# Patient Record
Sex: Male | Born: 1945 | Race: Black or African American | Hispanic: No | Marital: Married | Smoking: Never smoker
Health system: Southern US, Community
[De-identification: ages and names within clinical notes are randomized; demographics above are authoritative.]

## PROBLEM LIST (undated history)

## (undated) DIAGNOSIS — I739 Peripheral vascular disease, unspecified: Secondary | ICD-10-CM

## (undated) DIAGNOSIS — I10 Essential (primary) hypertension: Secondary | ICD-10-CM

## (undated) DIAGNOSIS — R279 Unspecified lack of coordination: Secondary | ICD-10-CM

## (undated) DIAGNOSIS — I6529 Occlusion and stenosis of unspecified carotid artery: Secondary | ICD-10-CM

## (undated) DIAGNOSIS — R9431 Abnormal electrocardiogram [ECG] [EKG]: Secondary | ICD-10-CM

## (undated) DIAGNOSIS — F419 Anxiety disorder, unspecified: Secondary | ICD-10-CM

## (undated) DIAGNOSIS — F32A Depression, unspecified: Secondary | ICD-10-CM

## (undated) DIAGNOSIS — F329 Major depressive disorder, single episode, unspecified: Secondary | ICD-10-CM

## (undated) DIAGNOSIS — R079 Chest pain, unspecified: Secondary | ICD-10-CM

## (undated) HISTORY — PX: CATARACT EXTRACTION: SUR2

## (undated) HISTORY — DX: Peripheral vascular disease, unspecified: I73.9

## (undated) HISTORY — DX: Unspecified lack of coordination: R27.9

## (undated) HISTORY — DX: Major depressive disorder, single episode, unspecified: F32.9

## (undated) HISTORY — DX: Anxiety disorder, unspecified: F41.9

## (undated) HISTORY — DX: Depression, unspecified: F32.A

## (undated) HISTORY — PX: KNEE SURGERY: SHX244

## (undated) HISTORY — DX: Chest pain, unspecified: R07.9

## (undated) HISTORY — DX: Abnormal electrocardiogram (ECG) (EKG): R94.31

## (undated) HISTORY — DX: Occlusion and stenosis of unspecified carotid artery: I65.29

---

## 1966-05-01 HISTORY — PX: TONSILLECTOMY: SUR1361

## 1995-05-02 HISTORY — PX: NOSE SURGERY: SHX723

## 2010-05-01 HISTORY — PX: OTHER SURGICAL HISTORY: SHX169

## 2011-05-02 HISTORY — PX: INSERTION OF AHMED VALVE: SHX6254

## 2011-10-23 ENCOUNTER — Encounter (HOSPITAL_COMMUNITY): Payer: Self-pay | Admitting: *Deleted

## 2011-10-23 ENCOUNTER — Emergency Department (HOSPITAL_COMMUNITY)
Admission: EM | Admit: 2011-10-23 | Discharge: 2011-10-23 | Disposition: A | Discharge status: Home / Self Care | Payer: Medicare HMO | Attending: Emergency Medicine | Admitting: Emergency Medicine

## 2011-10-23 DIAGNOSIS — S139XXA Sprain of joints and ligaments of unspecified parts of neck, initial encounter: Secondary | ICD-10-CM | POA: Insufficient documentation

## 2011-10-23 DIAGNOSIS — X58XXXA Exposure to other specified factors, initial encounter: Secondary | ICD-10-CM | POA: Insufficient documentation

## 2011-10-23 DIAGNOSIS — M542 Cervicalgia: Secondary | ICD-10-CM | POA: Insufficient documentation

## 2011-10-23 DIAGNOSIS — R209 Unspecified disturbances of skin sensation: Secondary | ICD-10-CM | POA: Insufficient documentation

## 2011-10-23 DIAGNOSIS — Z9889 Other specified postprocedural states: Secondary | ICD-10-CM | POA: Insufficient documentation

## 2011-10-23 DIAGNOSIS — S161XXA Strain of muscle, fascia and tendon at neck level, initial encounter: Secondary | ICD-10-CM

## 2011-10-23 NOTE — ED Provider Notes (Signed)
History     CSN: [id number]  Arrival date & time 10/23/11  0946   First MD Initiated Contact with Patient 10/23/11 1040      Chief Complaint  Patient presents with  . Neck Pain    (Consider location/radiation/quality/duration/timing/severity/associated sxs/prior treatment) HPI Comments: Patient reports that he began having left sided posterior neck pain earlier this morning shortly after he woke up.  Pain worse with lateral rotation of the neck.  Patient reports that he has never had pain like this before.  No acute injury or trauma.  He denies any numbness or tingling.  He reports that initially his arm felt heavy, but no heaviness at this time.  No chest pain or shortness of breath.  No facial asymmetry, no difficulty swallowing, no difficulty speaking.  PMH significant for left carotid stent stent placed in January 2012.  No history of HTN or DM.  No prior history of CVA or TIA.  Patient did have lens implant of both eye January 2012.  He has not noticed any recent vision changes.    The history is provided by the patient.    History reviewed. No pertinent past medical history.  Past Surgical History  Procedure Date  . Carotid stents     History reviewed. No pertinent family history.  History  Substance Use Topics  . Smoking status: Not on file  . Smokeless tobacco: Not on file  . Alcohol Use:       Review of Systems  Constitutional: Negative for fever and chills.  HENT: Positive for neck pain and neck stiffness. Negative for trouble swallowing.   Eyes: Negative for pain and visual disturbance.  Respiratory: Negative for shortness of breath.   Cardiovascular: Negative for chest pain.  Gastrointestinal: Negative for nausea and vomiting.  Musculoskeletal: Negative for back pain, joint swelling and gait problem.  Skin: Negative for color change.  Neurological: Negative for dizziness, syncope, facial asymmetry, speech difficulty, weakness, light-headedness, numbness and  headaches.  Psychiatric/Behavioral: Negative for confusion.    Allergies  Review of patient's allergies indicates no known allergies.  Home Medications   Current Outpatient Rx  Name Route Sig Dispense Refill  . KETOTIFEN FUMARATE 0.025 % OP SOLN Both Eyes Place 1 drop into both eyes daily.      BP 137/80  Pulse 61  Temp 97.8 F (36.6 C) (Oral)  Resp 11  SpO2 96%  Physical Exam  Nursing note and vitals reviewed. Constitutional: He appears well-developed and well-nourished. No distress.  HENT:  Head: Normocephalic and atraumatic.  Mouth/Throat: Oropharynx is clear and moist.  Eyes: EOM are normal.       Artificial lens of both eyes  Neck: Normal range of motion. Neck supple.       Increased pain with lateral rotation of the neck.  Cardiovascular: Normal rate, regular rhythm, normal heart sounds and intact distal pulses.   Pulmonary/Chest: Effort normal and breath sounds normal. No respiratory distress. He has no wheezes.  Abdominal: Soft. Bowel sounds are normal.  Musculoskeletal: Normal range of motion. He exhibits no edema and no tenderness.  Neurological: He is alert. He has normal strength. He displays no tremor. No cranial nerve deficit or sensory deficit. Coordination and gait normal.  Reflex Scores:      Brachioradialis reflexes are 2+ on the right side and 2+ on the left side.      No ataxia Normal finger to nose testing.  Normal rapid alternating movements.  Skin: Skin is warm and dry. He  is not diaphoretic. No erythema.  Psychiatric: He has a normal mood and affect.    ED Course  Procedures (including critical care time)  Labs Reviewed - No data to display No results found.   No diagnosis found.    MDM  Patient presenting with left sided posterior neck pain that started this morning.  He denies any numbness or tingling.  Pain worse with lateral movement of neck.  Therefore, suspect that pain is muscular.  No focal neurological deficits on exam.  No  dizziness. No ataxia.  No vision changes.  Patient also evaluated and discussed with Dr. Juleen China.        Pascal Lux [place], PA-C 10/23/11 1826

## 2011-10-23 NOTE — ED Notes (Signed)
Patient has hx of eye surgery, he was unable to fully participate in the visual exam.  He has diff tolerating eyes open with light.  Patient states he did notice that his vision was more cloudy today.  erpa is at the bedside at this time

## 2011-10-23 NOTE — Discharge Instructions (Signed)
Cervical Sprain A cervical sprain is an injury in the neck in which the ligaments are stretched or torn. The ligaments are the tissues that hold the bones of the neck (vertebrae) in place.Cervical sprains can range from very mild to very severe. Most cervical sprains get better in 1 to 3 weeks, but it depends on the cause and extent of the injury. Severe cervical sprains can cause the neck vertebrae to be unstable. This can lead to damage of the spinal cord and can result in serious nervous system problems. Your caregiver will determine whether your cervical sprain is mild or severe. CAUSES  Severe cervical sprains may be caused by:  Contact sport injuries (football, rugby, wrestling, hockey, auto racing, gymnastics, diving, martial arts, boxing).   Motor vehicle collisions.   Whiplash injuries. This means the neck is forcefully whipped backward and forward.   Falls.  Mild cervical sprains may be caused by:   Awkward positions, such as cradling a telephone between your ear and shoulder.   Sitting in a chair that does not offer proper support.   Working at a poorly Marketing executive station.   Activities that require looking up or down for long periods of time.  SYMPTOMS   Pain, soreness, stiffness, or a burning sensation in the front, back, or sides of the neck. This discomfort may develop immediately after injury or it may develop slowly and not begin for 24 hours or more after an injury.   Pain or tenderness directly in the middle of the back of the neck.   Shoulder or upper back pain.   Limited ability to move the neck.   Headache.   Dizziness.   Weakness, numbness, or tingling in the hands or arms.   Muscle spasms.   Difficulty swallowing or chewing.   Tenderness and swelling of the neck.  DIAGNOSIS  Most of the time, your caregiver can diagnose this problem by taking your history and doing a physical exam. Your caregiver will ask about any known problems, such as  arthritis in the neck or a previous neck injury. X-rays may be taken to find out if there are any other problems, such as problems with the bones of the neck. However, an X-ray often does not reveal the full extent of a cervical sprain. Other tests such as a computed tomography (CT) scan or magnetic resonance imaging (MRI) may be needed. TREATMENT  Treatment depends on the severity of the cervical sprain. Mild sprains can be treated with rest, keeping the neck in place (immobilization), and pain medicines. Severe cervical sprains need immediate immobilization and an appointment with an orthopedist or neurosurgeon. Several treatment options are available to help with pain, muscle spasms, and other symptoms. Your caregiver may prescribe:  Medicines, such as pain relievers, numbing medicines, or muscle relaxants.   Physical therapy. This can include stretching exercises, strengthening exercises, and posture training. Exercises and improved posture can help stabilize the neck, strengthen muscles, and help stop symptoms from returning.  HOME CARE INSTRUCTIONS   Put ice on the injured area.   Put ice in a plastic bag.   Place a towel between your skin and the bag.   Leave the ice on for 15 to 20 minutes, 3 to 4 times a day.   Only take over-the-counter or prescription medicines for pain, discomfort, or fever as directed by your caregiver.   Keep all follow-up appointments as directed by your caregiver.   Make any needed adjustments to your work station to promote  good posture.   Avoid positions and activities that make your symptoms worse.   Warm up and stretch before being active to help prevent problems.  SEEK MEDICAL CARE IF:   Your pain is not controlled with medicine.   You are unable to decrease your pain medicine over time as planned.   Your activity level is not improving as expected.  SEEK IMMEDIATE MEDICAL CARE IF:   You develop any bleeding, stomach upset, or signs of an  allergic reaction to your medicine.   Your symptoms get worse.   You develop new, unexplained symptoms.   You have numbness, tingling, weakness, or paralysis in any part of your body.  MAKE SURE YOU:   Understand these instructions.   Will watch your condition.   Will get help right away if you are not doing well or get worse.  Document Released: 02/12/2007 Document Revised: 04/06/2011 Document Reviewed: 01/18/2011 [facility] Patient Information 2012 [place], [place].

## 2011-10-23 NOTE — ED Notes (Signed)
To ED for eval of left neck pain suddenly this am. States he 'went to make a move and the pain shot up my head'. Hx of carotid stents 2 yrs ago. No pain now, but feels 'a tingling' in the top of his head' and left arm tingling

## 2011-10-30 NOTE — ED Provider Notes (Signed)
Medical screening examination/treatment/procedure(s) were conducted as a shared visit with non-physician practitioner(s) and myself.  I personally evaluated the patient during the encounter.  Patient with atraumatic left lateral back pain. Woke up this morning. No acute neurological complaints and has a nonfocal neurological examination. Suspect possible skeletal given reproducibility with range of motion and palpation. Consider carotid or vertebral artery dissection but doubt. No midline spinal tenderness. Plan symptomatic treatment and outpatient followup as needed. Term precautions were discussed.  Raeford Razor, MD 10/30/11 [phone]

## 2011-11-24 ENCOUNTER — Encounter: Payer: Self-pay | Admitting: Cardiology

## 2011-11-24 ENCOUNTER — Ambulatory Visit: Payer: Medicare HMO | Admitting: Cardiology

## 2011-11-24 ENCOUNTER — Ambulatory Visit (INDEPENDENT_AMBULATORY_CARE_PROVIDER_SITE_OTHER): Payer: Medicare HMO | Admitting: Cardiology

## 2011-11-24 VITALS — BP 120/80 | HR 71 | Ht 68.0 in | Wt 208.4 lb

## 2011-11-24 DIAGNOSIS — I779 Disorder of arteries and arterioles, unspecified: Secondary | ICD-10-CM

## 2011-11-24 DIAGNOSIS — I2510 Atherosclerotic heart disease of native coronary artery without angina pectoris: Secondary | ICD-10-CM

## 2011-11-24 DIAGNOSIS — E785 Hyperlipidemia, unspecified: Secondary | ICD-10-CM

## 2011-11-24 DIAGNOSIS — I10 Essential (primary) hypertension: Secondary | ICD-10-CM

## 2011-11-24 MED ORDER — ASPIRIN EC 81 MG PO TBEC: 81.0000 mg | DELAYED_RELEASE_TABLET | Freq: Every day | ORAL | Status: DC

## 2011-11-24 NOTE — Progress Notes (Signed)
HPI Alexander Oneal is a 66 year old married African American male who is referred today by Dr. Nehemiah Settle for the past medical history of carotid artery disease in the left carotid stent.  He brings out outside records which I reviewed extensively. In summary, he had a left internal carotid artery and left common carotid or stent placed i home 05/30/2010. There were no complications. 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  He was on Plavix for while but is now on no antiplatelet drugs.  He's had a previous nuclear stress study in November 2011 which was negative for coronary ischemia with an EF of 62%. His last echocardiogram was performed in November 2011 and showed an EF of 55-60% with no shunts, no evidence of LV or LA thrombus, and a mildly dilated LA, and moderate concentric left ventricular hypertrophy. 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 He's had a negative sleep study for apnea.  He's totally asymptomatic. He has chosen not to take a statin. As mentioned, is not on antiplatelet drug.  He has no angina, ischemic vascular symptoms either cerebrovascular, coronary wise or peripheral vascular. He does have hypertension and is on medication for such. He says his blood pressures usually good control.  Past Medical History  Diagnosis Date  . Carotid artery stenosis   . Lack of coordination   . Chest pain   . EKG, abnormal   . Peripheral vascular disease     Current Outpatient Prescriptions  Medication Sig Dispense Refill  . brimonidine (ALPHAGAN P) 0.1 % SOLN Place 1 drop into the right eye daily.      . Bromfenac Sodium (BROMDAY) 0.09 % SOLN Apply to eye.      . dorzolamide-timolol (COSOPT) 22.3-6.8 MG/ML ophthalmic solution Place 1 drop into the left eye 2 (two) times daily.      Marland Kitchen ketotifen (ZADITOR) 0.025 % ophthalmic solution Place 1 drop into both eyes daily.      . timolol (BETIMOL) 0.5 % ophthalmic solution 1 drop 2 (two) times daily.      Marland Kitchen aspirin  EC 81 MG tablet Take 1 tablet (81 mg total) by mouth daily.        No Known Allergies  Family History  Problem Relation Age of Onset  . Heart attack Mother   . Heart attack Father     History   Social History  . Marital Status: Married    Spouse Name: N/A    Number of Children: N/A  . Years of Education: N/A   Occupational History  . Not on file.   Social History Main Topics  . Smoking status: Never Smoker   . Smokeless tobacco: Not on file  . Alcohol Use: No  . Drug Use: No  . Sexually Active: Not on file   Other Topics Concern  . Not on file   Social History Narrative  . No narrative on file    ROS ALL NEGATIVE EXCEPT THOSE NOTED IN HPI  PE  General Appearance: well developed, well nourished in no acute distress HEENT: symmetrical face, PERRLA, good dentition  Neck: no JVD, thyromegaly, or adenopathy, trachea midline Chest: symmetric without deformity Cardiac: PMI non-displaced, RRR, normal S1, S2, no gallop or murmur Lung: clear to ausculation and percussion Vascular: all pulses full without bruits  Abdominal: nondistended, nontender, good bowel sounds, no HSM, no bruits Extremities: no cyanosis, clubbing or edema, no sign of DVT, no varicosities  Skin: normal color, no rashes Neuro: alert and  oriented x 3, non-focal Pysch: normal affect  EKG Normal sinus rhythm, nonspecific T wave changes, no change from previous EKG 01/05/2011. BMET No results found for this basename: na, k, cl, co2, glucose, bun, creatinine, calcium, gfrnonaa, gfraa    Lipid Panel  No results found for this basename: chol, trig, hdl, cholhdl, vldl, ldlcalc    CBC No results found for this basename: wbc, rbc, hgb, hct, plt, mcv, mch, mchc, rdw, neutrabs, lymphsabs, monoabs, eosabs, basosabs

## 2011-11-24 NOTE — Patient Instructions (Addendum)
Your physician has requested that you have a carotid duplex. This test is an ultrasound of the carotid arteries in your neck. It looks at blood flow through these arteries that supply the brain with blood. Allow one hour for this exam. There are no restrictions or special instructions.  Your physician has recommended you make the following change in your medication: Start taking an 81mg  Aspirin  Your physician wants you to follow-up in: 1 year with Dr. Daleen Squibb. You will receive a reminder letter in the mail two months in advance. If you don't receive a letter, please call our office to schedule the follow-up appointment.

## 2011-11-24 NOTE — Assessment & Plan Note (Signed)
Blood pressures a little bit higher today than usual. He will followup with Dr. Nehemiah Settle.

## 2011-11-24 NOTE — Assessment & Plan Note (Signed)
He wishes not to take a statin.

## 2011-11-24 NOTE — Assessment & Plan Note (Addendum)
He is doing well with previous stenting of the left common carotid left internal carotid artery. I have suggested him to start 81 mg of enteric-coated aspirin per day. He does not wish take statin. He is afraid of them. We'll have him return in one year.

## 2011-11-27 ENCOUNTER — Other Ambulatory Visit: Payer: Self-pay | Admitting: Cardiology

## 2011-11-27 DIAGNOSIS — I6529 Occlusion and stenosis of unspecified carotid artery: Secondary | ICD-10-CM

## 2011-11-28 ENCOUNTER — Encounter: Payer: Self-pay | Admitting: Cardiology

## 2011-11-29 ENCOUNTER — Encounter (INDEPENDENT_AMBULATORY_CARE_PROVIDER_SITE_OTHER): Payer: Medicare HMO

## 2011-11-29 DIAGNOSIS — I6529 Occlusion and stenosis of unspecified carotid artery: Secondary | ICD-10-CM

## 2012-07-25 ENCOUNTER — Telehealth: Payer: Self-pay | Admitting: Diagnostic Neuroimaging

## 2012-07-25 NOTE — Telephone Encounter (Signed)
Called pt to resched per reduce sched. Spse and pt said will need to leave appt as sched.

## 2012-07-26 ENCOUNTER — Encounter: Payer: Self-pay | Admitting: Diagnostic Neuroimaging

## 2012-07-26 ENCOUNTER — Ambulatory Visit (INDEPENDENT_AMBULATORY_CARE_PROVIDER_SITE_OTHER): Payer: Medicare HMO | Admitting: Diagnostic Neuroimaging

## 2012-07-26 VITALS — BP 149/85 | HR 63 | Temp 97.4°F | Ht 69.0 in | Wt 205.0 lb

## 2012-07-26 DIAGNOSIS — H348392 Tributary (branch) retinal vein occlusion, unspecified eye, stable: Secondary | ICD-10-CM

## 2012-07-26 DIAGNOSIS — H348192 Central retinal vein occlusion, unspecified eye, stable: Secondary | ICD-10-CM | POA: Insufficient documentation

## 2012-07-26 DIAGNOSIS — R51 Headache: Secondary | ICD-10-CM

## 2012-07-26 DIAGNOSIS — H543 Unqualified visual loss, both eyes: Secondary | ICD-10-CM | POA: Insufficient documentation

## 2012-07-26 DIAGNOSIS — H348312 Tributary (branch) retinal vein occlusion, right eye, stable: Secondary | ICD-10-CM

## 2012-07-26 DIAGNOSIS — H34812 Central retinal vein occlusion, left eye: Secondary | ICD-10-CM

## 2012-07-26 DIAGNOSIS — R519 Headache, unspecified: Secondary | ICD-10-CM | POA: Insufficient documentation

## 2012-07-26 MED ORDER — ASPIRIN 81 MG PO TABS: 81.0000 mg | ORAL_TABLET | Freq: Every day | ORAL | Status: AC

## 2012-07-26 NOTE — Progress Notes (Signed)
[facility]  PATIENT: Alexander Oneal DOB: 1945/08/14  REFERRING CLINICIAN: [facility] / [place] HISTORY FROM: patient and wife REASON FOR VISIT: vision changes, headache   HISTORICAL  CHIEF COMPLAINT:  Chief Complaint  Patient presents with  . Visual Field Change    blurry vision    HISTORY OF PRESENT ILLNESS:   67 year old right-handed male with history of right branch retinal vein occlusion (ischemic), left central retinal vein occlusion (ischemic), glaucoma, cataracts, status post bilateral eye surgeries, here for evaluation of six-month history of increasing pain and hypersensitivity in the right side of his head and face, blurred vision, deteriorating vision.  Patient denies any right temporal throbbing pain, jaw claudication, severe headache. He has had increasing sensitivity in his scalp and face. This affects him when he combs his hair or brushes his face with his hand.  Patient also reports evaluation in [place], when he had onset of these visual difficulties, and was found to have left carotid stenosis, treated by a cardiologist with a carotid stent procedure. Patient was on aspirin and Plavix for a few months and then stopped.   Interestingly, patient does not have any significant vascular risk factors such as hypertension, diabetes, hypercholesteremia, smoking. Today blood pressure is 149/85. Patient's wife tells me that his blood pressure actually runs low normally, less than 120/80.  REVIEW OF SYSTEMS: Full 14 system review of systems performed and notable only for hearing loss, ringing in the ears, itching, blurred vision, loss of vision, cough, constipation, urination problems, rectal dysfunction, feeling cold, allergies, memory loss, confusion, insomnia, numbness, depression, anxiety, not in a sleep, decreased energy, change in appetite, disinterest in activities, suicidal thoughts, racing thoughts.  ALLERGIES: No Known Allergies  HOME  MEDICATIONS: Outpatient Prescriptions Prior to Visit  Medication Sig Dispense Refill  . timolol (BETIMOL) 0.5 % ophthalmic solution 1 drop 2 (two) times daily.      . brimonidine (ALPHAGAN P) 0.1 % SOLN Place 1 drop into the right eye daily.      . Bromfenac Sodium (BROMDAY) 0.09 % SOLN Apply to eye.      . dorzolamide-timolol (COSOPT) 22.3-6.8 MG/ML ophthalmic solution Place 1 drop into the left eye 2 (two) times daily.      Marland Kitchen ketotifen (ZADITOR) 0.025 % ophthalmic solution Place 1 drop into both eyes daily.      Marland Kitchen aspirin EC 81 MG tablet Take 1 tablet (81 mg total) by mouth daily.       No facility-administered medications prior to visit.    PAST MEDICAL HISTORY: Past Medical History  Diagnosis Date  . Carotid artery stenosis   . Lack of coordination   . Chest pain   . EKG, abnormal   . Peripheral vascular disease   . Depression   . Anxiety     PAST SURGICAL HISTORY: Past Surgical History  Procedure Laterality Date  . Carotid stents  2012  . Knee surgery      Right  . Cataract extraction      Both eyes  . Tonsillectomy  1968  . Nose surgery  1997  . Insertion of ahmed valve  2013    x2    FAMILY HISTORY: Family History  Problem Relation Age of Onset  . Heart attack Mother   . Heart attack Father     SOCIAL HISTORY:  History   Social History  . Marital Status: Married    Spouse Name: N/A    Number of Children: N/A  . Years of Education: N/A  Occupational History  . Not on file.   Social History Main Topics  . Smoking status: Never Smoker   . Smokeless tobacco: Not on file  . Alcohol Use: No  . Drug Use: No  . Sexually Active: Not on file   Other Topics Concern  . Not on file   Social History Narrative   Pt lives at home with his spouse. He is retired, has four children, and has some college education level. He does not consume caffeine.      PHYSICAL EXAM  Filed Vitals:   07/26/12 0901  BP: 149/85  Pulse: 63  Temp: 97.4 F (36.3 C)    TempSrc: Oral  Height: 5\' 9"  (1.753 m)  Weight: 205 lb (92.987 kg)   Body mass index is 30.26 kg/(m^2).  GENERAL EXAM: Patient is in no distress  CARDIOVASCULAR: Regular rate and rhythm, no murmurs, no carotid bruits  NEUROLOGIC: MENTAL STATUS: awake, alert, language fluent, comprehension intact, naming intact CRANIAL NERVE: no papilledema on fundoscopic exam, RIGHT PUPIL REACTIVE, LEFT PUPIL POST-SURGICAL NO RXN, visual fields full to confrontation IN RIGHT EYE; LEFT LIGHT PERCEPTION AND ABLE TO COUNT FINGER AND DETECT MOTION, extraocular muscles intact, no nystagmus, facial sensation and strength symmetric, uvula midline, shoulder shrug symmetric, tongue midline. MOTOR: normal bulk and tone, full strength in the BUE, BLE SENSORY: normal and symmetric to light touch, pinprick, temperature, vibration COORDINATION: finger-nose-finger, fine finger movements normal REFLEXES: deep tendon reflexes present and symmetric GAIT/STATION: narrow based gait; romberg is negative   DIAGNOSTIC DATA (LABS, IMAGING, TESTING) - I reviewed patient records, labs, notes, testing and imaging myself where available.  No results found for this basename: WBC, HGB, HCT, MCV, PLT   No results found for this basename: na, k, cl, co2, glucose, bun, creatinine, calcium, prot, albumin, ast, alt, alkphos, bilitot, gfrnonaa, gfraa   No results found for this basename: CHOL, HDL, LDLCALC, LDLDIRECT, TRIG, CHOLHDL   No results found for this basename: HGBA1C   No results found for this basename: VITAMINB12   No results found for this basename: TSH     ASSESSMENT AND PLAN  67 y.o. year old male  has a past medical history of Carotid artery stenosis; Lack of coordination; Chest pain; EKG, abnormal; Peripheral vascular disease; Depression; and Anxiety. here with progressive visual deterioration, multifactorial from central and branch retinal vein occlusions, glaucoma and cataracts, that is being managed by  ophthalmology [facility]). Patient has a very complex ophthalmologic history and has been recently seen by Dr. Olean Ree in [place], [place] on 07/10/12. I reviewed his clinic notes which mention above ophthalmologic diagnoses. There is no mention of concern of giant cell arteritis in these notes.  On the referring notes from patient's PCP Dr. Nehemiah Settle, there is mention of referral to see me to evaluate for giant cell arteritis at the request of the ophthalmologist. Due to this discrepancy, I called to the patient's ophthalmologist, and spoke with his colleague Dr. Duke Salvia (covering for Dr. Olean Ree) by phone, who concurred that at least by review of the notes, there is no clinical suspicion for giant cell arteritis at this time.  His more recent symptoms of scalp and head sensitivity, raise possibility of CNS involvement. I will check ESR, CRP, MRI of the brain, MRA of the head/neck for further evaluation. I have asked patient to start taking aspirin 81 mg per day as well.  I will request PCP to follow up on BP (149/85), and screen for diabetes and hyperlipidemia.  Orders Placed This Encounter  Procedures  . MR Brain Wo Contrast  . MR MRA HEAD WO CONTRAST  . MR Angiogram Neck W Wo Contrast  . Sedimentation Rate  . C-reactive Protein   I reviewed notes, called other provider and discussed by phone, reviewed labs and chart, requiring complex medical decision making. Additional testing has been ordered.  Suanne Marker, MD 07/26/2012, 10:21 AM Certified in Neurology, Neurophysiology and Neuroimaging  [facility] [address] [place], [place] [address] [phone]

## 2012-07-26 NOTE — Patient Instructions (Addendum)
Take aspirin 81mg  per day. Follow up with PCP re: high blood pressure reading today.

## 2012-07-27 LAB — C-REACTIVE PROTEIN: CRP: 0.1 mg/L (ref 0.0–4.9)

## 2012-07-30 ENCOUNTER — Telehealth: Payer: Self-pay | Admitting: Diagnostic Neuroimaging

## 2012-07-30 NOTE — Telephone Encounter (Signed)
Called hm# lvm labs normal. Tried Cynthia#, no answer, no vmail left.

## 2012-07-30 NOTE — Telephone Encounter (Signed)
Message copied by Ellin Goodie on Tue Jul 30, 2012 10:56 AM ------      Message from: Joycelyn Schmid      Created: Mon Jul 29, 2012  1:40 PM       Normal labs. Please call patient. -VRP ------

## 2012-08-05 ENCOUNTER — Ambulatory Visit
Admission: RE | Admit: 2012-08-05 | Discharge: 2012-08-05 | Disposition: A | Discharge status: Home / Self Care | Payer: Medicare HMO | Source: Ambulatory Visit | Attending: Diagnostic Neuroimaging | Admitting: Diagnostic Neuroimaging

## 2012-08-05 DIAGNOSIS — H543 Unqualified visual loss, both eyes: Secondary | ICD-10-CM

## 2012-08-05 DIAGNOSIS — R51 Headache: Secondary | ICD-10-CM

## 2012-08-05 MED ORDER — GADOBENATE DIMEGLUMINE 529 MG/ML IV SOLN
19.0000 mL | Freq: Once | INTRAVENOUS | Status: AC | PRN
  Administered 2012-08-05: 19 mL via INTRAVENOUS

## 2012-08-16 ENCOUNTER — Telehealth: Payer: Self-pay

## 2012-08-16 NOTE — Telephone Encounter (Signed)
Called to give MRI results, no answer. No vmail left. Will try later

## 2012-09-06 ENCOUNTER — Telehealth: Payer: Self-pay

## 2012-09-06 NOTE — Telephone Encounter (Signed)
Spoke to pt,  gave results. Request last OV note and MRI results sent to ophthalmologist. Faxed notes.

## 2012-09-06 NOTE — Telephone Encounter (Signed)
Message copied by Doree Barthel on Fri Sep 06, 2012  4:02 PM ------      Message from: Joycelyn Schmid      Created: Fri Aug 30, 2012  5:24 PM       Mild chronic small vessel ischemic disease. Continue current plan. Pls call pt with results.             -VRP             ----- Message -----         From: Ashley Akin         Sent: 08/13/2012  10:06 AM           To: Suanne Marker, MD                   ------

## 2012-09-11 ENCOUNTER — Telehealth: Payer: Self-pay

## 2012-09-11 NOTE — Telephone Encounter (Signed)
Scheduled 2 month f/u appt.

## 2012-09-27 ENCOUNTER — Encounter: Payer: Self-pay | Admitting: Diagnostic Neuroimaging

## 2012-09-27 ENCOUNTER — Ambulatory Visit (INDEPENDENT_AMBULATORY_CARE_PROVIDER_SITE_OTHER): Payer: Medicare HMO | Admitting: Diagnostic Neuroimaging

## 2012-09-27 VITALS — BP 120/82 | HR 72 | Temp 97.4°F | Ht 68.0 in | Wt 199.5 lb

## 2012-09-27 DIAGNOSIS — H348192 Central retinal vein occlusion, unspecified eye, stable: Secondary | ICD-10-CM

## 2012-09-27 DIAGNOSIS — H34812 Central retinal vein occlusion, left eye: Secondary | ICD-10-CM

## 2012-09-27 DIAGNOSIS — H348312 Tributary (branch) retinal vein occlusion, right eye, stable: Secondary | ICD-10-CM

## 2012-09-27 DIAGNOSIS — R51 Headache: Secondary | ICD-10-CM

## 2012-09-27 DIAGNOSIS — H543 Unqualified visual loss, both eyes: Secondary | ICD-10-CM

## 2012-09-27 DIAGNOSIS — H348392 Tributary (branch) retinal vein occlusion, unspecified eye, stable: Secondary | ICD-10-CM

## 2012-09-27 NOTE — Patient Instructions (Signed)
Follow up prn as needed.

## 2012-09-27 NOTE — Progress Notes (Signed)
[facility]  PATIENT: Alexander Oneal DOB: 1945/09/22  HISTORY FROM: patient REASON FOR VISIT: follow up   HISTORICAL  CHIEF COMPLAINT:  Chief Complaint  Patient presents with  . Follow-up    HISTORY OF PRESENT ILLNESS:   UPDATE 09/27/12:  Patient comes in for follow up today for discussion of test results. He is still experiencing blurred vision and loss of vision, which is progressively worse, with shooting pains in the right side of his head that has not changed, scalp tenderness which has not changed.  PRIOR HPI (07/26/12): 67 year old right-handed male with history of right branch retinal vein occlusion (ischemic), left central retinal vein occlusion (ischemic), glaucoma, cataracts, status post bilateral eye surgeries, here for evaluation of six-month history of increasing pain and hypersensitivity in the right side of his head and face, blurred vision, deteriorating vision.   Patient denies any right temporal throbbing pain, jaw claudication, severe headache. He has had increasing sensitivity in his scalp and face. This affects him when he combs his hair or brushes his face with his hand.  Patient also reports evaluation in [place], when he had onset of these visual difficulties, and was found to have left carotid stenosis, treated by a cardiologist with a carotid stent procedure. Patient was on aspirin and Plavix for a few months and then stopped.   Interestingly, patient does not have any significant vascular risk factors such as hypertension, diabetes, hypercholesteremia, smoking. Today blood pressure is 149/85. Patient's wife tells me that his blood pressure actually runs low normally, less than 120/80.    REVIEW OF SYSTEMS: Full 14 system review of systems performed and notable only for blurred vision, loss of vision, feeling cold, memory loss, sleepiness, depression, anxiety, suicidal thoughts, racing thoughts.  ALLERGIES: No Known Allergies  HOME  MEDICATIONS: Outpatient Prescriptions Prior to Visit  Medication Sig Dispense Refill  . aspirin 81 MG tablet Take 1 tablet (81 mg total) by mouth daily.  30 tablet    . brimonidine (ALPHAGAN P) 0.1 % SOLN Place 1 drop into the right eye daily.      . Bromfenac Sodium (BROMDAY) 0.09 % SOLN Apply to eye.      . dorzolamide-timolol (COSOPT) 22.3-6.8 MG/ML ophthalmic solution Place 1 drop into the left eye 2 (two) times daily.      Marland Kitchen ketotifen (ZADITOR) 0.025 % ophthalmic solution Place 1 drop into both eyes daily.      Marland Kitchen LECITHIN PO Take by mouth.      . magnesium 30 MG tablet Take 30 mg by mouth 2 (two) times daily.      . Multiple Vitamin (MULTIVITAMIN) tablet Take 1 tablet by mouth daily.      . timolol (BETIMOL) 0.5 % ophthalmic solution 1 drop 2 (two) times daily.       No facility-administered medications prior to visit.    PAST MEDICAL HISTORY: Past Medical History  Diagnosis Date  . Carotid artery stenosis   . Lack of coordination   . Chest pain   . EKG, abnormal   . Peripheral vascular disease   . Depression   . Anxiety     PAST SURGICAL HISTORY: Past Surgical History  Procedure Laterality Date  . Carotid stents  2012  . Knee surgery      Right  . Cataract extraction      Both eyes  . Tonsillectomy  1968  . Nose surgery  1997  . Insertion of ahmed valve  2013    x2  FAMILY HISTORY: Family History  Problem Relation Age of Onset  . Heart attack Mother   . Heart attack Father     SOCIAL HISTORY:  History   Social History  . Marital Status: Married    Spouse Name: Aram Beecham    Number of Children: 4  . Years of Education: College   Occupational History  . Not on file.   Social History Main Topics  . Smoking status: Never Smoker   . Smokeless tobacco: Never Used  . Alcohol Use: No  . Drug Use: No  . Sexually Active: Not on file   Other Topics Concern  . Not on file   Social History Narrative   Pt lives at home with his spouse. He is retired,  has four children, and has some college education level. He does not consume caffeine.      PHYSICAL EXAM  Filed Vitals:   09/27/12 1131  BP: 120/82  Pulse: 72  Temp: 97.4 F (36.3 C)  TempSrc: Oral  Height: 5\' 8"  (1.727 m)  Weight: 199 lb 8 oz (90.493 kg)    Not recorded    Body mass index is 30.34 kg/(m^2).  GENERAL EXAM: Patient is in no distress   CARDIOVASCULAR:  Regular rate and rhythm, no murmurs, no carotid bruits   NEUROLOGIC:  MENTAL STATUS: awake, alert, language fluent, comprehension intact, naming intact  CRANIAL NERVE: no papilledema on fundoscopic exam, RIGHT PUPIL REACTIVE, LEFT PUPIL POST-SURGICAL NO RXN, visual fields full to confrontation IN RIGHT EYE; LEFT LIGHT PERCEPTION AND ABLE TO COUNT FINGER AND DETECT MOTION, extraocular muscles intact, no nystagmus, facial sensation and strength symmetric, uvula midline, shoulder shrug symmetric, tongue midline.  MOTOR: normal bulk and tone, full strength in the BUE, BLE  SENSORY: normal and symmetric to light touch, pinprick, temperature, vibration  COORDINATION: finger-nose-finger, fine finger movements normal  REFLEXES: deep tendon reflexes present and symmetric  GAIT/STATION: narrow based gait; romberg is negative    DIAGNOSTIC DATA (LABS, IMAGING, TESTING) - I reviewed patient records, labs, notes, testing and imaging myself where available.  MR ANGIOGRAM NECK W WO CONTRAST 08/05/12:  1. Left common carotid artery stent, with associated signal void, and normal appearing flow signal proximal and distal to the stent. 2. 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  MRA HEAD WO CONTRAST 08/05/12: Abnormal MRA head (without) demonstrating: 1. The right middle cerebral artery branches (M2, M3) have decreased signal, may reflect atherosclerosis.  2. 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 3. Remaining medium and large sized vessels are unremarkable.  MRI BRAIN WO 08/05/12: Abnormal MRI  brain (without) demonstrating:  1. Mild chronic small vessel ischemic disease.  2. No acute findings.  ESR 4  CRP 0.1  ASSESSMENT AND PLAN   67 y.o. year old male here with progressive visual deterioration, multifactorial from central and branch retinal vein occlusions, glaucoma and cataracts, that is being managed by ophthalmology [facility] and [facility]).  No evidence of temporal arteritis at this time (by ophthalmology exam/notes and normal ESR, CRP).  His more recent symptoms of scalp and head sensitivity, coul;d be migraine variant or occipital neuralgia or paroxysmal hemicrania. Symptoms are mild and patient does not want any meds for pain/symptom control.   PLAN: 1. Request PCP to follow up on BP (149/85), and screen for diabetes and hyperlipidemia. 2. Continue aspirin 81mg  daily 3. Follow up with ophthalmology re: eyes/vision    Suanne Marker, MD 09/27/2012, 11:59 AM Certified in Neurology, Neurophysiology and Neuroimaging  [facility] [address], [address] [place], [place] [address] [phone] 

## 2012-10-01 ENCOUNTER — Encounter: Payer: Self-pay | Admitting: Cardiology

## 2012-10-01 ENCOUNTER — Ambulatory Visit (INDEPENDENT_AMBULATORY_CARE_PROVIDER_SITE_OTHER): Payer: Medicare HMO | Admitting: Cardiology

## 2012-10-01 VITALS — BP 124/82 | HR 69 | Ht 68.0 in | Wt 200.0 lb

## 2012-10-01 DIAGNOSIS — I779 Disorder of arteries and arterioles, unspecified: Secondary | ICD-10-CM

## 2012-10-01 DIAGNOSIS — I10 Essential (primary) hypertension: Secondary | ICD-10-CM

## 2012-10-01 DIAGNOSIS — E785 Hyperlipidemia, unspecified: Secondary | ICD-10-CM

## 2012-10-01 NOTE — Assessment & Plan Note (Signed)
Good control

## 2012-10-01 NOTE — Assessment & Plan Note (Signed)
Refuses to take a statin.

## 2012-10-01 NOTE — Patient Instructions (Addendum)
Your physician wants you to follow-up in: 1 YEAR with Dr Branch.  You will receive a reminder letter in the mail two months in advance. If you don't receive a letter, please call our office to schedule the follow-up appointment.  Your physician recommends that you continue on your current medications as directed. Please refer to the Current Medication list given to you today.  

## 2012-10-01 NOTE — Assessment & Plan Note (Signed)
Stable. Continue aspirin 81 mg a day. Followup in one year.

## 2012-10-01 NOTE — Progress Notes (Signed)
HPI [name] returns today for her history of carotid artery intervention, specifically stenting of the left common carotid artery and left internal carotid artery in the past. At that time I saw him last year, he did not wish take aspirin or statin.  He is a complex ophthalmology history and was recently told that he had occluded retinal veins in both eyes. He was worked up by [facility] neurology with MRI and MRA of the brain and neck. I reviewed these which did not show any problems with his carotids. He does have small vessel disease. He has one hypoplastic vertebral artery. He was advised to take aspirin which he is now doing. He still refuses to take a statin. He was not advised to take Coumadin. He also a normal CRP and sed rate.  Echocardiogram 2 years ago showed some mild left atrial dilatation. He denies any symptoms consistent with A. fib. This has never been documented. Past Medical History  Diagnosis Date  . Carotid artery stenosis   . Lack of coordination   . Chest pain   . EKG, abnormal   . Peripheral vascular disease   . Depression   . Anxiety     Current Outpatient Prescriptions  Medication Sig Dispense Refill  . aspirin 81 MG tablet Take 1 tablet (81 mg total) by mouth daily.  30 tablet    . ketotifen (ZADITOR) 0.025 % ophthalmic solution Place 1 drop into both eyes daily.      Marland Kitchen LECITHIN PO Take by mouth.      . magnesium 30 MG tablet Take 30 mg by mouth 2 (two) times daily.      . Multiple Vitamin (MULTIVITAMIN) tablet Take 1 tablet by mouth daily.      . timolol (BETIMOL) 0.5 % ophthalmic solution 1 drop 2 (two) times daily.       No current facility-administered medications for this visit.    No Known Allergies  Family History  Problem Relation Age of Onset  . Heart attack Mother   . Heart attack Father     History   Social History  . Marital Status: Married    Spouse Name: Aram Beecham    Number of Children: 4  . Years of Education: College   Occupational  History  . Not on file.   Social History Main Topics  . Smoking status: Never Smoker   . Smokeless tobacco: Never Used  . Alcohol Use: No  . Drug Use: No  . Sexually Active: Not on file   Other Topics Concern  . Not on file   Social History Narrative   Pt lives at home with his spouse. He is retired, has four children, and has some college education level. He does not consume caffeine.     ROS ALL NEGATIVE EXCEPT THOSE NOTED IN HPI  PE  General Appearance: well developed, well nourished in no acute distress, wearing sunglasses because of visual issues. HEENT: symmetrical face, PERRLA, good dentition  Neck: no JVD, thyromegaly, or adenopathy, trachea midline Chest: symmetric without deformity Cardiac: PMI non-displaced, RRR, normal S1, S2, no gallop or murmur Lung: clear to ausculation and percussion Vascular: all pulses full without bruits  Abdominal: nondistended, nontender, good bowel sounds, no HSM, no bruits Extremities: no cyanosis, clubbing or edema, no sign of DVT, no varicosities  Skin: normal color, no rashes Neuro: alert and oriented x 3, non-focal Pysch: normal affect  EKG Normal sinus rhythm, nonspecific T wave changes. BMET No results found for this basename:  na, k, cl, co2, glucose, bun, creatinine, calcium, gfrnonaa, gfraa    Lipid Panel  No results found for this basename: chol, trig, hdl, cholhdl, vldl, ldlcalc    CBC No results found for this basename: wbc, rbc, hgb, hct, plt, mcv, mch, mchc, rdw, neutrabs, lymphsabs, monoabs, eosabs, basosabs

## 2013-01-03 ENCOUNTER — Ambulatory Visit: Payer: Medicare HMO | Attending: Optometry | Admitting: Occupational Therapy

## 2013-01-03 DIAGNOSIS — IMO0001 Reserved for inherently not codable concepts without codable children: Secondary | ICD-10-CM | POA: Insufficient documentation

## 2013-01-03 DIAGNOSIS — H543 Unqualified visual loss, both eyes: Secondary | ICD-10-CM | POA: Insufficient documentation

## 2013-01-03 DIAGNOSIS — H409 Unspecified glaucoma: Secondary | ICD-10-CM | POA: Insufficient documentation

## 2013-03-07 ENCOUNTER — Encounter (INDEPENDENT_AMBULATORY_CARE_PROVIDER_SITE_OTHER): Payer: Self-pay

## 2013-03-07 ENCOUNTER — Ambulatory Visit (HOSPITAL_COMMUNITY): Payer: Medicare HMO | Attending: Cardiology

## 2013-03-07 DIAGNOSIS — I10 Essential (primary) hypertension: Secondary | ICD-10-CM | POA: Insufficient documentation

## 2013-03-07 DIAGNOSIS — E785 Hyperlipidemia, unspecified: Secondary | ICD-10-CM | POA: Insufficient documentation

## 2013-03-07 DIAGNOSIS — I658 Occlusion and stenosis of other precerebral arteries: Secondary | ICD-10-CM | POA: Insufficient documentation

## 2013-03-07 DIAGNOSIS — I6529 Occlusion and stenosis of unspecified carotid artery: Secondary | ICD-10-CM | POA: Insufficient documentation

## 2013-03-13 ENCOUNTER — Encounter (HOSPITAL_COMMUNITY): Payer: Self-pay

## 2014-01-15 ENCOUNTER — Ambulatory Visit (INDEPENDENT_AMBULATORY_CARE_PROVIDER_SITE_OTHER): Payer: Medicare HMO | Admitting: Physician Assistant

## 2014-01-15 ENCOUNTER — Encounter: Payer: Self-pay | Admitting: Physician Assistant

## 2014-01-15 VITALS — BP 92/68 | HR 64 | Ht 68.0 in | Wt 170.8 lb

## 2014-01-15 DIAGNOSIS — E785 Hyperlipidemia, unspecified: Secondary | ICD-10-CM

## 2014-01-15 DIAGNOSIS — I6529 Occlusion and stenosis of unspecified carotid artery: Secondary | ICD-10-CM

## 2014-01-15 DIAGNOSIS — I6523 Occlusion and stenosis of bilateral carotid arteries: Secondary | ICD-10-CM

## 2014-01-15 DIAGNOSIS — I658 Occlusion and stenosis of other precerebral arteries: Secondary | ICD-10-CM

## 2014-01-15 DIAGNOSIS — M542 Cervicalgia: Secondary | ICD-10-CM

## 2014-01-15 NOTE — Patient Instructions (Signed)
Your physician has requested that you have a carotid duplex YOUR APPT IS 03/18/14 @ 9 AM. This test is an ultrasound of the carotid arteries in your neck. It looks at blood flow through these arteries that supply the brain with blood. Allow one hour for this exam. There are no restrictions or special instructions.   Your physician recommends that you continue on your current medications as directed. Please refer to the Current Medication list given to you today.   Your physician wants you to follow-up in: 1 YEAR WITH DR. Johnell Comings will receive a reminder letter in the mail two months in advance. If you don't receive a letter, please call our office to schedule the follow-up appointment.

## 2014-01-15 NOTE — Progress Notes (Signed)
Cardiology Office Note    Date:  01/15/2014   ID:  Alexander Oneal, DOB 1945-11-13, MRN [id number]  PCP:  Katy Apo, MD  Cardiologist:  Dr. Valera Castle >> Dr. Tobias Alexander     History of Present Illness: Alexander Oneal is a 68 y.o. male with a hx of carotid stenosis s/p L carotid stent at [facility] in [place], [place] in 05/2010.  He was previously followed by Dr. Daleen Squibb.  He returns for evaluation of neck pain.  He recently noted L sided neck pain brought on and made worse with certain head positions.  Rubbing it makes it better.  He denies any symptoms consistent with Amaurosis Fugax or TIA.  He denies chest pain, dyspnea, orthopnea, PND, edema, syncope.     Studies:  - Echo (12/12):  EF 60-65%, borderline LVH, mild LAE, mild to mod RAE, Trace MR, mild PI, mild TR, RVSP 30-40 mmHg  - Carotid US (11/14):  Patent L CCA/ICA stent; bilateral ICA 1-39% >>FU 1 year   Recent Labs/Images: No results found for requested labs within last 365 days.     Wt Readings from Last 3 Encounters:  01/15/14 170 lb 12.8 oz (77.474 kg)  10/01/12 200 lb (90.719 kg)  09/27/12 199 lb 8 oz (90.493 kg)     Past Medical History  Diagnosis Date  . Carotid artery stenosis   . Lack of coordination   . Chest pain   . EKG, abnormal   . Peripheral vascular disease   . Depression   . Anxiety     Current Outpatient Prescriptions  Medication Sig Dispense Refill  . aspirin 81 MG tablet Take 1 tablet (81 mg total) by mouth daily.  30 tablet    . ketotifen (ZADITOR) 0.025 % ophthalmic solution Place 1 drop into both eyes daily.      Marland Kitchen LECITHIN PO Take by mouth.      . magnesium 30 MG tablet Take 30 mg by mouth 2 (two) times daily.      . Multiple Vitamin (MULTIVITAMIN) tablet Take 1 tablet by mouth daily.      . timolol (BETIMOL) 0.5 % ophthalmic solution 1 drop 2 (two) times daily.       No current facility-administered medications for this visit.     Allergies:   Acetazolamide; Brimonidine;  Dexamethasone; Ketotifen fumarate; Prednisolone; and Timolol maleate   Social History:  The patient  reports that he has never smoked. He has never used smokeless tobacco. He reports that he does not drink alcohol or use illicit drugs.   Family History:  The patient's family history includes Heart attack in his father and mother.   ROS:  Please see the history of present illness.   He has occasional indigestion.   All other systems reviewed and negative.   PHYSICAL EXAM: VS:  BP 92/68  Pulse 64  Ht  (1.727 m)  Wt 170 lb 12.8 oz (77.474 kg)  BMI 25.98 kg/m2 Well nourished, well developed, in no acute distress HEENT: normal Neck: no JVD Cardiac:  normal S1, S2; RRR; no murmur Lungs:  clear to auscultation bilaterally, no wheezing, rhonchi or rales Abd: soft, nontender, no hepatomegaly Ext: no edema Skin: warm and dry Neuro:  CNs 2-12 intact, no focal abnormalities noted  EKG:  NSR, HR 64, normal axis, no ST changes      ASSESSMENT AND PLAN:  1. Carotid stenosis, bilateral:  Continue ASA.  He refuses statin Rx.  Arrange FU Carotid  US in 03/2014.   2. Hyperlipidemia LDL goal <70:  He refuses statin Rx.  Managed by PCP. 3. Neck pain:  This is clearly MSK.  Reassurance. Conservative measures discussed.    Disposition:  He lives in [place] and would rather FU her.  FU with Dr. Tobias Alexander in 1 year.     Signed, Brynda Rim, MHS 01/15/2014 1:28 PM    [name] [address] [place], [place], [place]  [address] Phone: [phone]; Fax: [phone]

## 2014-03-18 ENCOUNTER — Ambulatory Visit (HOSPITAL_COMMUNITY): Payer: Medicare HMO | Attending: Cardiovascular Disease | Admitting: Cardiology

## 2014-03-18 DIAGNOSIS — I6523 Occlusion and stenosis of bilateral carotid arteries: Secondary | ICD-10-CM

## 2014-03-18 DIAGNOSIS — I10 Essential (primary) hypertension: Secondary | ICD-10-CM | POA: Insufficient documentation

## 2014-03-18 DIAGNOSIS — R42 Dizziness and giddiness: Secondary | ICD-10-CM | POA: Insufficient documentation

## 2014-03-18 DIAGNOSIS — E785 Hyperlipidemia, unspecified: Secondary | ICD-10-CM | POA: Diagnosis not present

## 2014-03-18 NOTE — Progress Notes (Signed)
Carotid duplex performed 

## 2014-03-20 ENCOUNTER — Telehealth: Payer: Self-pay | Admitting: *Deleted

## 2014-03-20 ENCOUNTER — Encounter: Payer: Self-pay | Admitting: Physician Assistant

## 2014-03-20 NOTE — Telephone Encounter (Signed)
s/w pt today about carotid results with verbal understanding to results given today.

## 2015-05-28 ENCOUNTER — Inpatient Hospital Stay (HOSPITAL_COMMUNITY)
Admission: EM | Admit: 2015-05-28 | Discharge: 2015-05-30 | DRG: 195 | Disposition: A | Discharge status: Home / Self Care | Payer: Medicare HMO | Attending: Internal Medicine | Admitting: Internal Medicine

## 2015-05-28 ENCOUNTER — Encounter (HOSPITAL_COMMUNITY): Payer: Self-pay | Admitting: Emergency Medicine

## 2015-05-28 ENCOUNTER — Emergency Department (HOSPITAL_COMMUNITY): Payer: Medicare HMO

## 2015-05-28 DIAGNOSIS — J189 Pneumonia, unspecified organism: Secondary | ICD-10-CM | POA: Diagnosis not present

## 2015-05-28 DIAGNOSIS — I10 Essential (primary) hypertension: Secondary | ICD-10-CM | POA: Diagnosis present

## 2015-05-28 DIAGNOSIS — I739 Peripheral vascular disease, unspecified: Secondary | ICD-10-CM | POA: Diagnosis present

## 2015-05-28 DIAGNOSIS — I6523 Occlusion and stenosis of bilateral carotid arteries: Secondary | ICD-10-CM | POA: Diagnosis present

## 2015-05-28 DIAGNOSIS — Z888 Allergy status to other drugs, medicaments and biological substances status: Secondary | ICD-10-CM

## 2015-05-28 DIAGNOSIS — J181 Lobar pneumonia, unspecified organism: Secondary | ICD-10-CM | POA: Diagnosis not present

## 2015-05-28 DIAGNOSIS — Z7982 Long term (current) use of aspirin: Secondary | ICD-10-CM

## 2015-05-28 DIAGNOSIS — D696 Thrombocytopenia, unspecified: Secondary | ICD-10-CM | POA: Diagnosis present

## 2015-05-28 HISTORY — DX: Essential (primary) hypertension: I10

## 2015-05-28 LAB — BASIC METABOLIC PANEL
Anion gap: 11 (ref 5–15)
BUN: 12 mg/dL (ref 6–20)
CHLORIDE: 104 mmol/L (ref 101–111)
CO2: 28 mmol/L (ref 22–32)
CREATININE: 1.01 mg/dL (ref 0.61–1.24)
Calcium: 9.4 mg/dL (ref 8.9–10.3)
Glucose, Bld: 130 mg/dL — ABNORMAL HIGH (ref 65–99)
Potassium: 4.1 mmol/L (ref 3.5–5.1)
SODIUM: 143 mmol/L (ref 135–145)

## 2015-05-28 LAB — CBC
HCT: 42.2 % (ref 39.0–52.0)
HEMOGLOBIN: 13.6 g/dL (ref 13.0–17.0)
MCH: 27.7 pg (ref 26.0–34.0)
MCHC: 32.2 g/dL (ref 30.0–36.0)
MCV: 85.9 fL (ref 78.0–100.0)
PLATELETS: 147 10*3/uL — AB (ref 150–400)
RBC: 4.91 MIL/uL (ref 4.22–5.81)
RDW: 12.6 % (ref 11.5–15.5)
WBC: 12 10*3/uL — ABNORMAL HIGH (ref 4.0–10.5)

## 2015-05-28 LAB — I-STAT TROPONIN, ED: TROPONIN I, POC: 0 ng/mL (ref 0.00–0.08)

## 2015-05-28 NOTE — ED Notes (Signed)
EDP at bedside  

## 2015-05-28 NOTE — ED Provider Notes (Signed)
CSN: [id number]     Arrival date & time 05/28/15  2136 History  By signing my name below, I, Alexander Oneal, attest that this documentation has been prepared under the direction and in the presence of Alexander Crease, MD . Electronically Signed: Linna Oneal, Scribe. 05/28/2015. 11:21 PM.    Chief Complaint  Patient presents with  . Tachycardia  . Back Pain      The history is provided by the patient. No language interpreter was used.     HPI Comments: Alexander Oneal is a 70 y.o. male who presents to the Emergency Department complaining of gradual onset, worsening, left-sided, lower back pain beginning 1-2 weeks ago  He reports that pain is worse with deep palpation to the area without any alleviating factors. He reports that he has been in town for a funeral, and his symptoms could be a product of the related stress. Pt reports seeing a doctor at [facility] today and was referred to the ED for associated dizziness, weakness, chills, and diaphoresis. Pt endorses mild SOB as well as a burning sensation in his left thigh. He denies chest pain, numbness, or any other associated symptoms at this time.     Past Medical History  Diagnosis Date  . Carotid artery stenosis     Carotid US (11/15):  Patent L CCA/ICA stent, bilateral ICA 1-39% >> FU 2 years  . Lack of coordination   . Chest pain   . EKG, abnormal   . Peripheral vascular disease (HCC)   . Depression   . Anxiety   . Hypertension    Past Surgical History  Procedure Laterality Date  . Carotid stents  2012  . Knee surgery      Right  . Cataract extraction      Both eyes  . Tonsillectomy  1968  . Nose surgery  1997  . Insertion of ahmed valve  2013    x2   Family History  Problem Relation Age of Onset  . Heart attack Mother   . Heart attack Father    Social History  Substance Use Topics  . Smoking status: Never Smoker   . Smokeless tobacco: Never Used  . Alcohol Use: No    Review of Systems   Constitutional: Positive for chills and diaphoresis.  Respiratory: Positive for shortness of breath (mild).   Cardiovascular: Negative for chest pain.  Neurological: Positive for dizziness and weakness. Negative for numbness.  All other systems reviewed and are negative.     Allergies  Acetazolamide; Brimonidine; Dexamethasone; Ketotifen fumarate; Prednisolone; and Timolol maleate  Home Medications   Prior to Admission medications   Medication Sig Start Date End Date Taking? Authorizing Provider  dorzolamide-timolol (COSOPT) 22.3-6.8 MG/ML ophthalmic solution Place 1 drop into both eyes 2 (two) times daily.   Yes Historical Provider, MD  aspirin 81 MG tablet Take 1 tablet (81 mg total) by mouth daily. Patient not taking: Reported on 05/28/2015 07/26/12   Suanne Marker, MD   BP 124/76 mmHg  Pulse 108  Temp(Src) 99.4 F (37.4 C) (Oral)  Resp 19  SpO2 99% Physical Exam  Constitutional: He is oriented to person, place, and time. He appears well-developed and well-nourished. No distress.  HENT:  Head: Normocephalic and atraumatic.  Right Ear: Hearing normal.  Left Ear: Hearing normal.  Nose: Nose normal.  Mouth/Throat: Oropharynx is clear and moist and mucous membranes are normal.  Eyes: Conjunctivae and EOM are normal. Pupils are equal, round, and reactive  to light.  Neck: Normal range of motion. Neck supple.  Cardiovascular: Regular rhythm, S1 normal and S2 normal.  Exam reveals no gallop and no friction rub.   No murmur heard. Pulmonary/Chest: Effort normal and breath sounds normal. No respiratory distress. He exhibits no tenderness.  Left lower chest to wall tenderness Left infrascapular tenderness  Abdominal: Soft. Normal appearance and bowel sounds are normal. There is no hepatosplenomegaly. There is no tenderness. There is no rebound, no guarding, no tenderness at McBurney's point and negative Murphy's sign. No hernia.  Musculoskeletal: Normal range of motion.   Neurological: He is alert and oriented to person, place, and time. He has normal strength. No cranial nerve deficit or sensory deficit. Coordination normal. GCS eye subscore is 4. GCS verbal subscore is 5. GCS motor subscore is 6.  Skin: Skin is warm, dry and intact. No cyanosis (fingers are dusky, but not clearly cyanotic).  Psychiatric: He has a normal mood and affect. His speech is normal and behavior is normal. Thought content normal.  Nursing note and vitals reviewed.   ED Course  Procedures (including critical care time)  DIAGNOSTIC STUDIES: Oxygen Saturation is 100% on RA, normal by my interpretation.    COORDINATION OF CARE:  11:22 PM Will order CXR and CT Chest. Will administer 80 mL injection Omnipaque. Discussed treatment plan with pt at bedside and pt agreed to plan.   Results for orders placed or performed during the hospital encounter of 05/28/15  Basic metabolic panel  Result Value Ref Range   Sodium 143 135 - 145 mmol/L   Potassium 4.1 3.5 - 5.1 mmol/L   Chloride 104 101 - 111 mmol/L   CO2 28 22 - 32 mmol/L   Glucose, Bld 130 (H) 65 - 99 mg/dL   BUN 12 6 - 20 mg/dL   Creatinine, Ser 1.61 0.61 - 1.24 mg/dL   Calcium 9.4 8.9 - 09.6 mg/dL   GFR calc non Af Amer >60 >60 mL/min   GFR calc Af Amer >60 >60 mL/min   Anion gap 11 5 - 15  CBC  Result Value Ref Range   WBC 12.0 (H) 4.0 - 10.5 K/uL   RBC 4.91 4.22 - 5.81 MIL/uL   Hemoglobin 13.6 13.0 - 17.0 g/dL   HCT 04.5 40.9 - 81.1 %   MCV 85.9 78.0 - 100.0 fL   MCH 27.7 26.0 - 34.0 pg   MCHC 32.2 30.0 - 36.0 g/dL   RDW 91.4 78.2 - 95.6 %   Platelets 147 (L) 150 - 400 K/uL  Brain natriuretic peptide  Result Value Ref Range   B Natriuretic Peptide 10.6 0.0 - 100.0 pg/mL  Urinalysis, Routine w reflex microscopic (not at [facility])  Result Value Ref Range   Color, Urine YELLOW YELLOW   APPearance CLEAR CLEAR   Specific Gravity, Urine 1.023 1.005 - 1.030   pH 6.5 5.0 - 8.0   Glucose, UA NEGATIVE NEGATIVE mg/dL    Hgb urine dipstick NEGATIVE NEGATIVE   Bilirubin Urine NEGATIVE NEGATIVE   Ketones, ur 40 (A) NEGATIVE mg/dL   Protein, ur NEGATIVE NEGATIVE mg/dL   Nitrite NEGATIVE NEGATIVE   Leukocytes, UA NEGATIVE NEGATIVE  I-stat troponin, ED (not at [facility])  Result Value Ref Range   Troponin i, poc 0.00 0.00 - 0.08 ng/mL   Comment 3          I-Stat CG4 Lactic Acid, ED  Result Value Ref Range   Lactic Acid, Venous 1.35 0.5 - 2.0 mmol/L  Dg Chest 2 View  05/28/2015  CLINICAL DATA:  Left-sided back pain since 1700 hours today. Dizziness 2 days ago. Nonsmoker. EXAM: CHEST  2 VIEW COMPARISON:  None. FINDINGS: Mild hyperinflation. Normal heart size and pulmonary vascularity. No focal airspace disease or consolidation in the lungs. No blunting of costophrenic angles. No pneumothorax. Mediastinal contours appear intact. Degenerative changes in the thoracic spine. IMPRESSION: No active cardiopulmonary disease. Electronically Signed   By: Burman Nieves M.D.   On: 05/28/2015 22:31   Ct Angio Chest Pe W/cm &/or Wo Cm  05/29/2015  CLINICAL DATA:  Gradual onset worsening left-sided low back pain beginning 1-2 weeks ago. [facility] and was referred to the ED. Dizziness, weakness, chills, diaphoresis, and tachycardia. Mild shortness of breath. EXAM: CT ANGIOGRAPHY CHEST WITH CONTRAST TECHNIQUE: Multidetector CT imaging of the chest was performed using the standard protocol during bolus administration of intravenous contrast. Multiplanar CT image reconstructions and MIPs were obtained to evaluate the vascular anatomy. CONTRAST:  OMNIPAQUE IOHEXOL 350 MG/ML SOLN COMPARISON:  None. FINDINGS: Technically adequate study with good opacification of the central and segmental pulmonary arteries. No focal filling defects. No evidence of significant pulmonary embolus. Normal heart size. Normal caliber thoracic aorta. No aortic dissection. Great vessel origins are patent. Calcification in the  abdominal aorta and coronary arteries. Esophagus is decompressed. No significant lymphadenopathy in the chest. There is consolidation in the left lower lung with associated occlusion of a left lower lobe bronchus. This could represent pneumonia with mucoid secretions in the bronchus although in endobronchial obstructing lesion is not excluded. Suggest follow-up after resolution of acute process to exclude underlying endobronchial lesion. Right lung is clear. Mild emphysematous changes bilaterally. No pleural effusions. No pneumothorax. Included portions of the upper abdominal organs demonstrate a stone in the upper pole right kidney measuring 6 mm diameter. No hydronephrosis. Probable parapelvic cyst in the left kidney. Probable sub cm hepatic cyst. Degenerative changes in the spine. No destructive bone lesions. Review of the MIP images confirms the above findings. IMPRESSION: No evidence of significant pulmonary embolus. There is consolidation in the left lower lung possibly representing pneumonia. This is associated with occlusion of the left lower lobe bronchus, probably due to a inspissated mucus but followup after resolution of acute process is recommended to exclude underlying endobronchial lesion. Electronically Signed   By: Burman Nieves M.D.   On: 05/29/2015 01:01     I have personally reviewed and evaluated these images and lab results as part of my medical decision-making.   EKG Interpretation   Date/Time:  Friday May 28 2015 21:43:58 EST Ventricular Rate:  110 PR Interval:  188 QRS Duration: 76 QT Interval:  314 QTC Calculation: 424 R Axis:   62 Text Interpretation:  Sinus tachycardia Nonspecific ST abnormality Abnormal  ECG No previous tracing Confirmed by Santonio Speakman  MD, Henretta Quist ([id number]) on  05/29/2015 2:05:52 AM      MDM   Final diagnoses:  CAP (community acquired pneumonia)    Patient presents to the ER for evaluation of left-sided rib and back pain has been ongoing  for 1-2 weeks. He has had a persistent cough with this. He has noticed increased pain with changes in position. Patient seen at urgent care earlier and was found to be tachycardic, diaphoretic, cyanotic and was sent to the ER.  Tachycardia is confirmed, patient has a persistent sinus tachycardia in the 110 range. Troponin is negative. Urinalysis does not show any sign of infection. Lactate  was normal. Basic metabolic panel unremarkable, CBC shows slight leukocytosis of 12. He also has mild thrombocytopenia.  Chest x-ray did not show evidence of pneumonia or abnormality. Patient has recently traveled here from [place]. CT angiography was therefore performed. No evidence of PE found, but he does have some consolidation at the left lower lung with occlusion of the bronchus. Pneumonia vs mass considered in differential diagnosis.  Patient appears ill, continues to be tachycardic. He will require admission for further treatment and evaluation.  I personally performed the services described in this documentation, which was scribed in my presence. The recorded information has been reviewed and is accurate.     Alexander Crease, MD 05/29/15 [phone]

## 2015-05-28 NOTE — ED Notes (Addendum)
C/o intermittent L sided thoracic back pain that is positional since this morning.  Pt seen at [facility] and sent to ED for cyanotic hands, diaphoresis, and tachycardia. Denies chest pain.

## 2015-05-29 ENCOUNTER — Encounter (HOSPITAL_COMMUNITY): Payer: Self-pay | Admitting: Radiology

## 2015-05-29 ENCOUNTER — Emergency Department (HOSPITAL_COMMUNITY): Payer: Medicare HMO

## 2015-05-29 DIAGNOSIS — Z7982 Long term (current) use of aspirin: Secondary | ICD-10-CM | POA: Diagnosis not present

## 2015-05-29 DIAGNOSIS — I6523 Occlusion and stenosis of bilateral carotid arteries: Secondary | ICD-10-CM | POA: Diagnosis present

## 2015-05-29 DIAGNOSIS — J189 Pneumonia, unspecified organism: Secondary | ICD-10-CM | POA: Diagnosis not present

## 2015-05-29 DIAGNOSIS — J181 Lobar pneumonia, unspecified organism: Secondary | ICD-10-CM | POA: Diagnosis present

## 2015-05-29 DIAGNOSIS — I10 Essential (primary) hypertension: Secondary | ICD-10-CM | POA: Diagnosis not present

## 2015-05-29 DIAGNOSIS — I739 Peripheral vascular disease, unspecified: Secondary | ICD-10-CM | POA: Diagnosis present

## 2015-05-29 DIAGNOSIS — Z888 Allergy status to other drugs, medicaments and biological substances status: Secondary | ICD-10-CM | POA: Diagnosis not present

## 2015-05-29 DIAGNOSIS — D696 Thrombocytopenia, unspecified: Secondary | ICD-10-CM | POA: Diagnosis present

## 2015-05-29 LAB — BASIC METABOLIC PANEL
Anion gap: 7 (ref 5–15)
BUN: 10 mg/dL (ref 6–20)
CALCIUM: 8.6 mg/dL — AB (ref 8.9–10.3)
CHLORIDE: 108 mmol/L (ref 101–111)
CO2: 26 mmol/L (ref 22–32)
CREATININE: 0.88 mg/dL (ref 0.61–1.24)
Glucose, Bld: 134 mg/dL — ABNORMAL HIGH (ref 65–99)
POTASSIUM: 3.5 mmol/L (ref 3.5–5.1)
SODIUM: 141 mmol/L (ref 135–145)

## 2015-05-29 LAB — CBC
HEMATOCRIT: 35 % — AB (ref 39.0–52.0)
HEMOGLOBIN: 11.6 g/dL — AB (ref 13.0–17.0)
MCH: 28.2 pg (ref 26.0–34.0)
MCHC: 33.1 g/dL (ref 30.0–36.0)
MCV: 85 fL (ref 78.0–100.0)
Platelets: 143 10*3/uL — ABNORMAL LOW (ref 150–400)
RBC: 4.12 MIL/uL — AB (ref 4.22–5.81)
RDW: 12.7 % (ref 11.5–15.5)
WBC: 13.5 10*3/uL — ABNORMAL HIGH (ref 4.0–10.5)

## 2015-05-29 LAB — URINALYSIS, ROUTINE W REFLEX MICROSCOPIC
BILIRUBIN URINE: NEGATIVE
GLUCOSE, UA: NEGATIVE mg/dL
Hgb urine dipstick: NEGATIVE
KETONES UR: 40 mg/dL — AB
LEUKOCYTES UA: NEGATIVE
NITRITE: NEGATIVE
PH: 6.5 (ref 5.0–8.0)
Protein, ur: NEGATIVE mg/dL
SPECIFIC GRAVITY, URINE: 1.023 (ref 1.005–1.030)

## 2015-05-29 LAB — BRAIN NATRIURETIC PEPTIDE: B NATRIURETIC PEPTIDE 5: 10.6 pg/mL (ref 0.0–100.0)

## 2015-05-29 LAB — I-STAT CG4 LACTIC ACID, ED: LACTIC ACID, VENOUS: 1.35 mmol/L (ref 0.5–2.0)

## 2015-05-29 MED ORDER — IOHEXOL 350 MG/ML SOLN
80.0000 mL | Freq: Once | INTRAVENOUS | Status: AC | PRN
  Administered 2015-05-29: 100 mL via INTRAVENOUS

## 2015-05-29 MED ORDER — ACETAMINOPHEN 650 MG RE SUPP: 650.0000 mg | Freq: Four times a day (QID) | RECTAL | Status: DC | PRN

## 2015-05-29 MED ORDER — AZITHROMYCIN 500 MG PO TABS
500.0000 mg | ORAL_TABLET | ORAL | Status: DC
  Administered 2015-05-30: 500 mg via ORAL
  Filled 2015-05-29 (×2): qty 1

## 2015-05-29 MED ORDER — SODIUM CHLORIDE 0.9 % IV BOLUS (SEPSIS)
1000.0000 mL | Freq: Once | INTRAVENOUS | Status: AC
  Administered 2015-05-29: 1000 mL via INTRAVENOUS

## 2015-05-29 MED ORDER — HYDRALAZINE HCL 20 MG/ML IJ SOLN: 5.0000 mg | Freq: Four times a day (QID) | INTRAMUSCULAR | Status: DC | PRN

## 2015-05-29 MED ORDER — DEXTROSE 5 % IV SOLN: 1.0000 g | INTRAVENOUS | Status: DC

## 2015-05-29 MED ORDER — DORZOLAMIDE HCL-TIMOLOL MAL 2-0.5 % OP SOLN
1.0000 [drp] | Freq: Two times a day (BID) | OPHTHALMIC | Status: DC
Start: 2015-05-29 — End: 2015-05-30
  Administered 2015-05-29 – 2015-05-30 (×3): 1 [drp] via OPHTHALMIC
  Filled 2015-05-29: qty 10

## 2015-05-29 MED ORDER — ASPIRIN EC 81 MG PO TBEC
81.0000 mg | DELAYED_RELEASE_TABLET | Freq: Every day | ORAL | Status: DC
  Administered 2015-05-29 – 2015-05-30 (×2): 81 mg via ORAL
  Filled 2015-05-29 (×2): qty 1

## 2015-05-29 MED ORDER — DEXTROSE 5 % IV SOLN
1.0000 g | INTRAVENOUS | Status: DC
  Administered 2015-05-29 – 2015-05-30 (×2): 1 g via INTRAVENOUS
  Filled 2015-05-29 (×2): qty 10

## 2015-05-29 MED ORDER — SODIUM CHLORIDE 0.9 % IV SOLN
INTRAVENOUS | Status: DC
  Administered 2015-05-29: 06:00:00 via INTRAVENOUS
  Administered 2015-05-30: 125 mL/h via INTRAVENOUS

## 2015-05-29 MED ORDER — ENOXAPARIN SODIUM 40 MG/0.4ML ~~LOC~~ SOLN
40.0000 mg | SUBCUTANEOUS | Status: DC
  Administered 2015-05-29 – 2015-05-30 (×2): 40 mg via SUBCUTANEOUS
  Filled 2015-05-29 (×2): qty 0.4

## 2015-05-29 MED ORDER — ACETAMINOPHEN 325 MG PO TABS: 650.0000 mg | ORAL_TABLET | Freq: Four times a day (QID) | ORAL | Status: DC | PRN

## 2015-05-29 NOTE — ED Notes (Signed)
EDP at bedside  

## 2015-05-29 NOTE — ED Notes (Addendum)
Attempted to give report to Reuel Boom RN on 5N, in the middle of giving report, nurse cut this RN off and stated "is this pt diabetic" told RN no he then stated "just bring the patient up" end of report. My charge RN aware of situation.

## 2015-05-29 NOTE — H&P (Signed)
History and Physical  Patient Name: Alexander Oneal     ZOX:[id number]    DOB: 11-20-1945    DOA: 05/28/2015 Referring physician: Dutch Quint, MD PCP: PROVIDER NOT IN SYSTEM      Chief Complaint: Cough and fever  HPI: Alexander Oneal is a 70 y.o. male with a past medical history significant for PVD s/p carotid PCI who presents with cough and fever.  The patient was in his usual state of health until the last few days at home in [place] when he was developing a rattling, productive cough, left-sided flank pain, and malaise.  Today, he flew down from [place] to [place] and when he arrived, he felt worse, had alternating fever and sweats and chills and was taken to UC where he was believed to be cyanotic and tachycardic sent to the ER.  In the ED, he had a low grade fever, tachycardia, and leukocytosis.  A CXR was completely clear, but CT angiogram of the chest to rule out PE given recent flight showed clear lobar LLL pneumonia and TRH were asked to evaluate for admission.     Review of Systems:  All other systems negative except as just noted or noted in the history of present illness.  Allergies  Allergen Reactions  . Acetazolamide Rash    AND DEHYDRATION  . Brimonidine Rash  . Dexamethasone Rash  . Ketotifen Fumarate Rash  . Prednisolone Other (See Comments) and Rash  . Timolol Maleate Rash    Prior to Admission medications   Medication Sig Start Date End Date Taking? Authorizing Provider  dorzolamide-timolol (COSOPT) 22.3-6.8 MG/ML ophthalmic solution Place 1 drop into both eyes 2 (two) times daily.   Yes Historical Provider, MD  aspirin 81 MG tablet Take 1 tablet (81 mg total) by mouth daily. Patient not taking: Reported on 05/28/2015 07/26/12   Suanne Marker, MD    Past Medical History  Diagnosis Date  . Carotid artery stenosis     Carotid US (11/15):  Patent L CCA/ICA stent, bilateral ICA 1-39% >> FU 2 years  . Lack of coordination   . Chest pain   . EKG, abnormal   . Peripheral  vascular disease (HCC)   . Depression   . Anxiety   . Hypertension     Past Surgical History  Procedure Laterality Date  . Carotid stents  2012  . Knee surgery      Right  . Cataract extraction      Both eyes  . Tonsillectomy  1968  . Nose surgery  1997  . Insertion of ahmed valve  2013    x2    Family history: family history includes Heart attack in his father and mother.  Social History: Patient lives with his wife.  He is a TEFL teacher witness and vegetarian.  He formerly worked for the [employer].  He does not smoke or use alcohol.  He ambulates without assistance and is independent with all ADLs and IADLs at baseline.       Physical Exam: BP 146/96 mmHg  Pulse 118  Temp(Src) 99.4 F (37.4 C) (Oral)  Resp 17  SpO2 99% General appearance: Well-developed, elderly adult male, alert but tired appearing, somewhat slow to respond.  Oriented x3.  Eyes: Anicteric, conjunctiva pink, lids and lashes normal.     ENT: No nasal deformity, discharge, or epistaxis.  OP moist without lesions.   Lymph: No cervical or supraclavicular lymphadenopathy. Skin: Warm and moist. Cardiac: Tachycardic, regular, nl S1-S2, no murmurs appreciated.  Capillary refill is brisk.  JVP normal.  No LE edema.  Radial pulses 2+ and symmetric. Respiratory: Normal respiratory rate and rhythm.  Rales and diminished breath sounds with dullness to percussion in L base. Abdomen: Abdomen soft without rigidity.  Mild L sided TTP. No ascites, distension.   MSK: No deformities or effusions. Neuro: Oriented x3 but slow.  Cranial nerves normal.  Sensorium intact and responding to questions, attention normal.  Speech is fluent.  Moves all extremities equally and with normal coordination.    Psych: Behavior appropriate.  Affect blunted.  No evidence of aural or visual hallucinations or delusions.       Labs on Admission:  The metabolic panel shows Normal sodium, potassium, bicarbonate, and renal  function. The complete blood count shows leukocytosis, mild thrombocytopenia. Troponin negative. BNP completely normal. UA shows ketones. Lactic acid normal.   Radiological Exams on Admission: Personally reviewed: Dg Chest 2 View 05/28/2015  Clear  Personally reviewed: Ct Angio Chest Pe W/cm &/or Wo Cm 05/29/2015  IMPRESSION: No evidence of significant pulmonary embolus. There is consolidation in the left lower lung possibly representing pneumonia. This is associated with occlusion of the left lower lobe bronchus, probably due to a inspissated mucus but followup after resolution of acute process is recommended to exclude underlying endobronchial lesion.    EKG: Independently reviewed. Sinus tachcyardia, rate 110, no ST TW changes.  QTc 424.    Assessment/Plan 1. CAP, left lower lobar pneumonia:  This is new.  PSI class III, recommend inpatient treatment given age and tachycardia. -Ceftriaxone and azithromycin  -Sputum culture and gram stain -Follow blood cultures and strep pneumo urine antigen -Incentive spirometry  -NS bolus once -Follow up chest x-ray as outpatient   2. HTN:  Hypertensive at admission, not currently treated? -IV hydralazine for SBP > 180 mmHg  3. PVD, carotid:  -Continue aspirin      DVT PPx: Lovenox Diet: Regular Consultants: None Code Status: Full Family Communication: Wife, daughter at bedside.  CODE STATUS confirmed.  All questions answered.  Patient has flight back to [place] on Sunday, but does not expect to make it.  Medical decision making: What exists of the patient's previous chart and CareEverywhere was reviewed in depth and the case was discussed with Dr. Blinda Leatherwood. Patient seen 2:50 AM on 05/29/2015.  Disposition Plan:  I recommend admission to inpatient med surgical status.  Clinical condition: stable.  Anticipate IV antibiotics for 24-48 hours then transition to oral antibiotics and discharge to home.      Alberteen Sam [facility] Pager [phone]

## 2015-05-29 NOTE — Progress Notes (Signed)
Patient seen and examined  Alexander Oneal is a 70 y.o. male with a past medical history significant for PVD s/p carotid PCI who presents with cough and fever. In the ED, he had a low grade fever, tachycardia, and leukocytosis. A CXR was completely clear, but CT angiogram of the chest to rule out PE given recent flight showed clear lobar LLL pneumonia and TRH were asked to evaluate   Overnight remained hemodynamically stable except for tachycardia, low-grade fever, 100% on room air  Plan 1.Left lower lobe pneumonia-likely community-acquired Check for flu PCR Strep pneumo/Legionella urine antigens pending Continue azithromycin and Rocephin  2. HTN:  Hypertensive at admission, not currently treated? -IV hydralazine for SBP > 180 mmHg  3. PVD, carotid:  -Continue aspirin

## 2015-05-30 DIAGNOSIS — I10 Essential (primary) hypertension: Secondary | ICD-10-CM

## 2015-05-30 LAB — URINE CULTURE

## 2015-05-30 LAB — COMPREHENSIVE METABOLIC PANEL
ALBUMIN: 2.9 g/dL — AB (ref 3.5–5.0)
ALT: 18 U/L (ref 17–63)
ANION GAP: 7 (ref 5–15)
AST: 26 U/L (ref 15–41)
Alkaline Phosphatase: 35 U/L — ABNORMAL LOW (ref 38–126)
BILIRUBIN TOTAL: 0.6 mg/dL (ref 0.3–1.2)
BUN: 6 mg/dL (ref 6–20)
CO2: 29 mmol/L (ref 22–32)
Calcium: 8.5 mg/dL — ABNORMAL LOW (ref 8.9–10.3)
Chloride: 107 mmol/L (ref 101–111)
Creatinine, Ser: 0.79 mg/dL (ref 0.61–1.24)
GFR calc Af Amer: >60 mL/min (ref >60)
GLUCOSE: 78 mg/dL (ref 65–99)
POTASSIUM: 3.9 mmol/L (ref 3.5–5.1)
Sodium: 143 mmol/L (ref 135–145)
TOTAL PROTEIN: 5.4 g/dL — AB (ref 6.5–8.1)

## 2015-05-30 LAB — CBC
HEMATOCRIT: 36.4 % — AB (ref 39.0–52.0)
Hemoglobin: 11.5 g/dL — ABNORMAL LOW (ref 13.0–17.0)
MCH: 27.2 pg (ref 26.0–34.0)
MCHC: 31.6 g/dL (ref 30.0–36.0)
MCV: 86.1 fL (ref 78.0–100.0)
Platelets: 151 10*3/uL (ref 150–400)
RBC: 4.23 MIL/uL (ref 4.22–5.81)
RDW: 12.8 % (ref 11.5–15.5)
WBC: 7.6 10*3/uL (ref 4.0–10.5)

## 2015-05-30 LAB — INFLUENZA PANEL BY PCR (TYPE A & B)
H1N1FLUPCR: NOT DETECTED
INFLAPCR: NEGATIVE
Influenza B By PCR: NEGATIVE

## 2015-05-30 MED ORDER — AZITHROMYCIN 500 MG PO TABS: 500.0000 mg | ORAL_TABLET | ORAL | Status: AC

## 2015-05-30 MED ORDER — HYDROCODONE-HOMATROPINE 5-1.5 MG/5ML PO SYRP: 5.0000 mL | ORAL_SOLUTION | Freq: Four times a day (QID) | ORAL | Status: AC | PRN

## 2015-05-30 MED ORDER — CEFDINIR 300 MG PO CAPS: 300.0000 mg | ORAL_CAPSULE | Freq: Two times a day (BID) | ORAL | Status: AC

## 2015-05-30 NOTE — Discharge Summary (Signed)
Physician Discharge Summary  Alexander Oneal MRN: [id number] DOB/AGE: 1945/08/30 70 y.o.  PCP: PROVIDER NOT IN SYSTEM   Admit date: 05/28/2015 Discharge date: 05/30/2015    Discharge Diagnoses:     Principal Problem:   CAP (community acquired pneumonia) Active Problems:   Hypertension, benign essential, goal below 140/90    Follow-up recommendations Follow-up with PCP in 3-5 days , including all  additional recommended appointments as below Follow-up CBC, CMP in 3-5 days Recommend pulmonary follow-up for possible bronchoscopy when the patient gets back to [place]     Medication List    TAKE these medications        aspirin 81 MG tablet  Take 1 tablet (81 mg total) by mouth daily.     azithromycin 500 MG tablet  Commonly known as:  ZITHROMAX  Take 1 tablet (500 mg total) by mouth daily.     cefdinir 300 MG capsule  Commonly known as:  OMNICEF  Take 1 capsule (300 mg total) by mouth 2 (two) times daily.     dorzolamide-timolol 22.3-6.8 MG/ML ophthalmic solution  Commonly known as:  COSOPT  Place 1 drop into both eyes 2 (two) times daily.     HYDROcodone-homatropine 5-1.5 MG/5ML syrup  Commonly known as:  HYCODAN  Take 5 mLs by mouth every 6 (six) hours as needed for cough.         Discharge Condition:  Discharge Instructions    Disposition: 01-Home or Self Care   Consults: none     Significant Diagnostic Studies:  Dg Chest 2 View  05/28/2015  CLINICAL DATA:  Left-sided back pain since 1700 hours today. Dizziness 2 days ago. Nonsmoker. EXAM: CHEST  2 VIEW COMPARISON:  None. FINDINGS: Mild hyperinflation. Normal heart size and pulmonary vascularity. No focal airspace disease or consolidation in the lungs. No blunting of costophrenic angles. No pneumothorax. Mediastinal contours appear intact. Degenerative changes in the thoracic spine. IMPRESSION: No active cardiopulmonary disease. Electronically Signed   By: Lucienne Capers M.D.   On: 05/28/2015 22:31    Ct Angio Chest Pe W/cm &/or Wo Cm  05/29/2015  CLINICAL DATA:  Gradual onset worsening left-sided low back pain beginning 1-2 weeks ago. [facility] and was referred to the ED. Dizziness, weakness, chills, diaphoresis, and tachycardia. Mild shortness of breath. EXAM: CT ANGIOGRAPHY CHEST WITH CONTRAST TECHNIQUE: Multidetector CT imaging of the chest was performed using the standard protocol during bolus administration of intravenous contrast. Multiplanar CT image reconstructions and MIPs were obtained to evaluate the vascular anatomy. CONTRAST:  114m OMNIPAQUE IOHEXOL 350 MG/ML SOLN COMPARISON:  None. FINDINGS: Technically adequate study with good opacification of the central and segmental pulmonary arteries. No focal filling defects. No evidence of significant pulmonary embolus. Normal heart size. Normal caliber thoracic aorta. No aortic dissection. Great vessel origins are patent. Calcification in the abdominal aorta and coronary arteries. Esophagus is decompressed. No significant lymphadenopathy in the chest. There is consolidation in the left lower lung with associated occlusion of a left lower lobe bronchus. This could represent pneumonia with mucoid secretions in the bronchus although in endobronchial obstructing lesion is not excluded. Suggest follow-up after resolution of acute process to exclude underlying endobronchial lesion. Right lung is clear. Mild emphysematous changes bilaterally. No pleural effusions. No pneumothorax. Included portions of the upper abdominal organs demonstrate a stone in the upper pole right kidney measuring 6 mm diameter. No hydronephrosis. Probable parapelvic cyst in the left kidney. Probable sub cm hepatic cyst. Degenerative changes in  the spine. No destructive bone lesions. Review of the MIP images confirms the above findings. IMPRESSION: No evidence of significant pulmonary embolus. There is consolidation in the left lower lung possibly representing  pneumonia. This is associated with occlusion of the left lower lobe bronchus, probably due to a inspissated mucus but followup after resolution of acute process is recommended to exclude underlying endobronchial lesion. Electronically Signed   By: Lucienne Capers M.D.   On: 05/29/2015 01:01        Filed Weights   05/29/15 0352  Weight: 69.9 kg (154 lb 1.6 oz)     Microbiology: No results found for this or any previous visit (from the past 240 hour(s)).     Blood Culture No results found for: SDES, [place], CULT, REPTSTATUS    Labs: Results for orders placed or performed during the hospital encounter of 05/28/15 (from the past 48 hour(s))  Basic metabolic panel     Status: Abnormal   Collection Time: 05/28/15  9:52 PM  Result Value Ref Range   Sodium 143 135 - 145 mmol/L   Potassium 4.1 3.5 - 5.1 mmol/L   Chloride 104 101 - 111 mmol/L   CO2 28 22 - 32 mmol/L   Glucose, Bld 130 (H) 65 - 99 mg/dL   BUN 12 6 - 20 mg/dL   Creatinine, Ser 1.01 0.61 - 1.24 mg/dL   Calcium 9.4 8.9 - 10.3 mg/dL   GFR calc non Af Amer >60 >60 mL/min   GFR calc Af Amer >60 >60 mL/min    Comment: (NOTE) The eGFR has been calculated using the CKD EPI equation. This calculation has not been validated in all clinical situations. eGFR's persistently <60 mL/min signify possible Chronic Kidney Disease.    Anion gap 11 5 - 15  CBC     Status: Abnormal   Collection Time: 05/28/15  9:52 PM  Result Value Ref Range   WBC 12.0 (H) 4.0 - 10.5 K/uL   RBC 4.91 4.22 - 5.81 MIL/uL   Hemoglobin 13.6 13.0 - 17.0 g/dL   HCT 42.2 39.0 - 52.0 %   MCV 85.9 78.0 - 100.0 fL   MCH 27.7 26.0 - 34.0 pg   MCHC 32.2 30.0 - 36.0 g/dL   RDW 12.6 11.5 - 15.5 %   Platelets 147 (L) 150 - 400 K/uL  Brain natriuretic peptide     Status: None   Collection Time: 05/28/15  9:52 PM  Result Value Ref Range   B Natriuretic Peptide 10.6 0.0 - 100.0 pg/mL  I-stat troponin, ED (not at [facility])     Status: None   Collection  Time: 05/28/15  9:58 PM  Result Value Ref Range   Troponin i, poc 0.00 0.00 - 0.08 ng/mL   Comment 3            Comment: Due to the release kinetics of cTnI, a negative result within the first hours of the onset of symptoms does not rule out myocardial infarction with certainty. If myocardial infarction is still suspected, repeat the test at appropriate intervals.   I-Stat CG4 Lactic Acid, ED     Status: None   Collection Time: 05/29/15 12:06 AM  Result Value Ref Range   Lactic Acid, Venous 1.35 0.5 - 2.0 mmol/L  Urinalysis, Routine w reflex microscopic (not at [facility])     Status: Abnormal   Collection Time: 05/29/15 12:16 AM  Result Value Ref Range   Color, Urine YELLOW YELLOW   APPearance CLEAR CLEAR  Specific Gravity, Urine 1.023 1.005 - 1.030   pH 6.5 5.0 - 8.0   Glucose, UA NEGATIVE NEGATIVE mg/dL   Hgb urine dipstick NEGATIVE NEGATIVE   Bilirubin Urine NEGATIVE NEGATIVE   Ketones, ur 40 (A) NEGATIVE mg/dL   Protein, ur NEGATIVE NEGATIVE mg/dL   Nitrite NEGATIVE NEGATIVE   Leukocytes, UA NEGATIVE NEGATIVE    Comment: MICROSCOPIC NOT DONE ON URINES WITH NEGATIVE PROTEIN, BLOOD, LEUKOCYTES, NITRITE, OR GLUCOSE <1000 mg/dL.  CBC     Status: Abnormal   Collection Time: 05/29/15  6:30 AM  Result Value Ref Range   WBC 13.5 (H) 4.0 - 10.5 K/uL   RBC 4.12 (L) 4.22 - 5.81 MIL/uL   Hemoglobin 11.6 (L) 13.0 - 17.0 g/dL   HCT 35.0 (L) 39.0 - 52.0 %   MCV 85.0 78.0 - 100.0 fL   MCH 28.2 26.0 - 34.0 pg   MCHC 33.1 30.0 - 36.0 g/dL   RDW 12.7 11.5 - 15.5 %   Platelets 143 (L) 150 - 400 K/uL  Basic metabolic panel     Status: Abnormal   Collection Time: 05/29/15  6:30 AM  Result Value Ref Range   Sodium 141 135 - 145 mmol/L   Potassium 3.5 3.5 - 5.1 mmol/L   Chloride 108 101 - 111 mmol/L   CO2 26 22 - 32 mmol/L   Glucose, Bld 134 (H) 65 - 99 mg/dL   BUN 10 6 - 20 mg/dL   Creatinine, Ser 0.88 0.61 - 1.24 mg/dL   Calcium 8.6 (L) 8.9 - 10.3 mg/dL   GFR calc non Af Amer >60 >60  mL/min   GFR calc Af Amer >60 >60 mL/min    Comment: (NOTE) The eGFR has been calculated using the CKD EPI equation. This calculation has not been validated in all clinical situations. eGFR's persistently <60 mL/min signify possible Chronic Kidney Disease.    Anion gap 7 5 - 15  CBC     Status: Abnormal   Collection Time: 05/30/15  6:12 AM  Result Value Ref Range   WBC 7.6 4.0 - 10.5 K/uL   RBC 4.23 4.22 - 5.81 MIL/uL   Hemoglobin 11.5 (L) 13.0 - 17.0 g/dL   HCT 36.4 (L) 39.0 - 52.0 %   MCV 86.1 78.0 - 100.0 fL   MCH 27.2 26.0 - 34.0 pg   MCHC 31.6 30.0 - 36.0 g/dL   RDW 12.8 11.5 - 15.5 %   Platelets 151 150 - 400 K/uL  Comprehensive metabolic panel     Status: Abnormal   Collection Time: 05/30/15  6:12 AM  Result Value Ref Range   Sodium 143 135 - 145 mmol/L   Potassium 3.9 3.5 - 5.1 mmol/L   Chloride 107 101 - 111 mmol/L   CO2 29 22 - 32 mmol/L   Glucose, Bld 78 65 - 99 mg/dL   BUN 6 6 - 20 mg/dL   Creatinine, Ser 0.79 0.61 - 1.24 mg/dL   Calcium 8.5 (L) 8.9 - 10.3 mg/dL   Total Protein 5.4 (L) 6.5 - 8.1 g/dL   Albumin 2.9 (L) 3.5 - 5.0 g/dL   AST 26 15 - 41 U/L   ALT 18 17 - 63 U/L   Alkaline Phosphatase 35 (L) 38 - 126 U/L   Total Bilirubin 0.6 0.3 - 1.2 mg/dL   GFR calc non Af Amer >60 >60 mL/min   GFR calc Af Amer >60 >60 mL/min    Comment: (NOTE) The eGFR has been calculated using  the CKD EPI equation. This calculation has not been validated in all clinical situations. eGFR's persistently <60 mL/min signify possible Chronic Kidney Disease.    Anion gap 7 5 - 15     Lipid Panel  No results found for: CHOL, TRIG, HDL, CHOLHDL, VLDL, LDLCALC, LDLDIRECT   No results found for: HGBA1C   Lab Results  Component Value Date   CREATININE 0.79 05/30/2015     HPI :*Alexander Oneal is a 70 y.o. male with a past medical history significant for PVD s/p carotid PCI who presents with cough and fever.  The patient was in his usual state of health until the last few  days at home in [place] when he was developing a rattling, productive cough, left-sided flank pain, and malaise. Patient flew down from [place] to [facility] and when he arrived on 1/28, he felt worse, had alternating fever and sweats and chills and was taken to urgent care where he was believed to be cyanotic and tachycardic sent to the ER.  In the ED, he had a low grade fever, tachycardia, and leukocytosis. A CXR was completely clear, but CT angiogram of the chest to rule out PE given recent flight showed clear lobar LLL pneumonia and TRH were asked to evaluate for admission.   HOSPITAL COURSE:   1.Left lower lobe pneumonia-likely community-acquired Flu PCR pending at the time of discharge Strep pneumo/Legionella urine antigens  also pending at the time of discharge Patient started on azithromycin and Rocephin , for 2 days Significant improvement in oxygenation, with resolution of fever, tachycardia, white count of 7.6 prior to discharge Patient ambulated and did not have any oxygen requirements prior to discharge   2. HTN:  Hypertensive at admission, not currently treated? Patient to follow-up with PCP for further management of his hypertension  3. PVD, carotid:  -Continue aspirin   Discharge Exam:    Blood pressure 129/82, pulse 58, temperature 98 F (36.7 C), temperature source Oral, resp. rate 18, height 5' 7"  (1.702 m), weight 69.9 kg (154 lb 1.6 oz), SpO2 100 %.  General appearance: Well-developed, elderly adult male, alert but tired appearing, somewhat slow to respond. Oriented x3.  Eyes: Anicteric, conjunctiva pink, lids and lashes normal.  ENT: No nasal deformity, discharge, or epistaxis. OP moist without lesions.  Lymph: No cervical or supraclavicular lymphadenopathy. Skin: Warm and moist. Cardiac: Tachycardic, regular, nl S1-S2, no murmurs appreciated. Capillary refill is brisk. JVP normal. No LE edema. Radial pulses 2+ and symmetric. Respiratory: Normal  respiratory rate and rhythm. Rales and diminished breath sounds with dullness to percussion in L base. Abdomen: Abdomen soft without rigidity. Mild L sided TTP.No ascites, distension.         Follow-up Information    Follow up with PCP. Schedule an appointment as soon as possible for a visit in 3 days.      SignedReyne Oneal 05/30/2015, 11:46 AM        Time spent >45 mins

## 2015-06-03 LAB — CULTURE, BLOOD (ROUTINE X 2)
CULTURE: NO GROWTH
Culture: NO GROWTH

## 2017-02-22 IMAGING — DX DG CHEST 2V
2 series · 2 of 2 positions shown · non-contrast
Comparison: None.

CLINICAL DATA: Left-sided back pain since 3500 hours today.
Dizziness 2 days ago. Nonsmoker.

EXAM:
CHEST  2 VIEW

[chest pa]
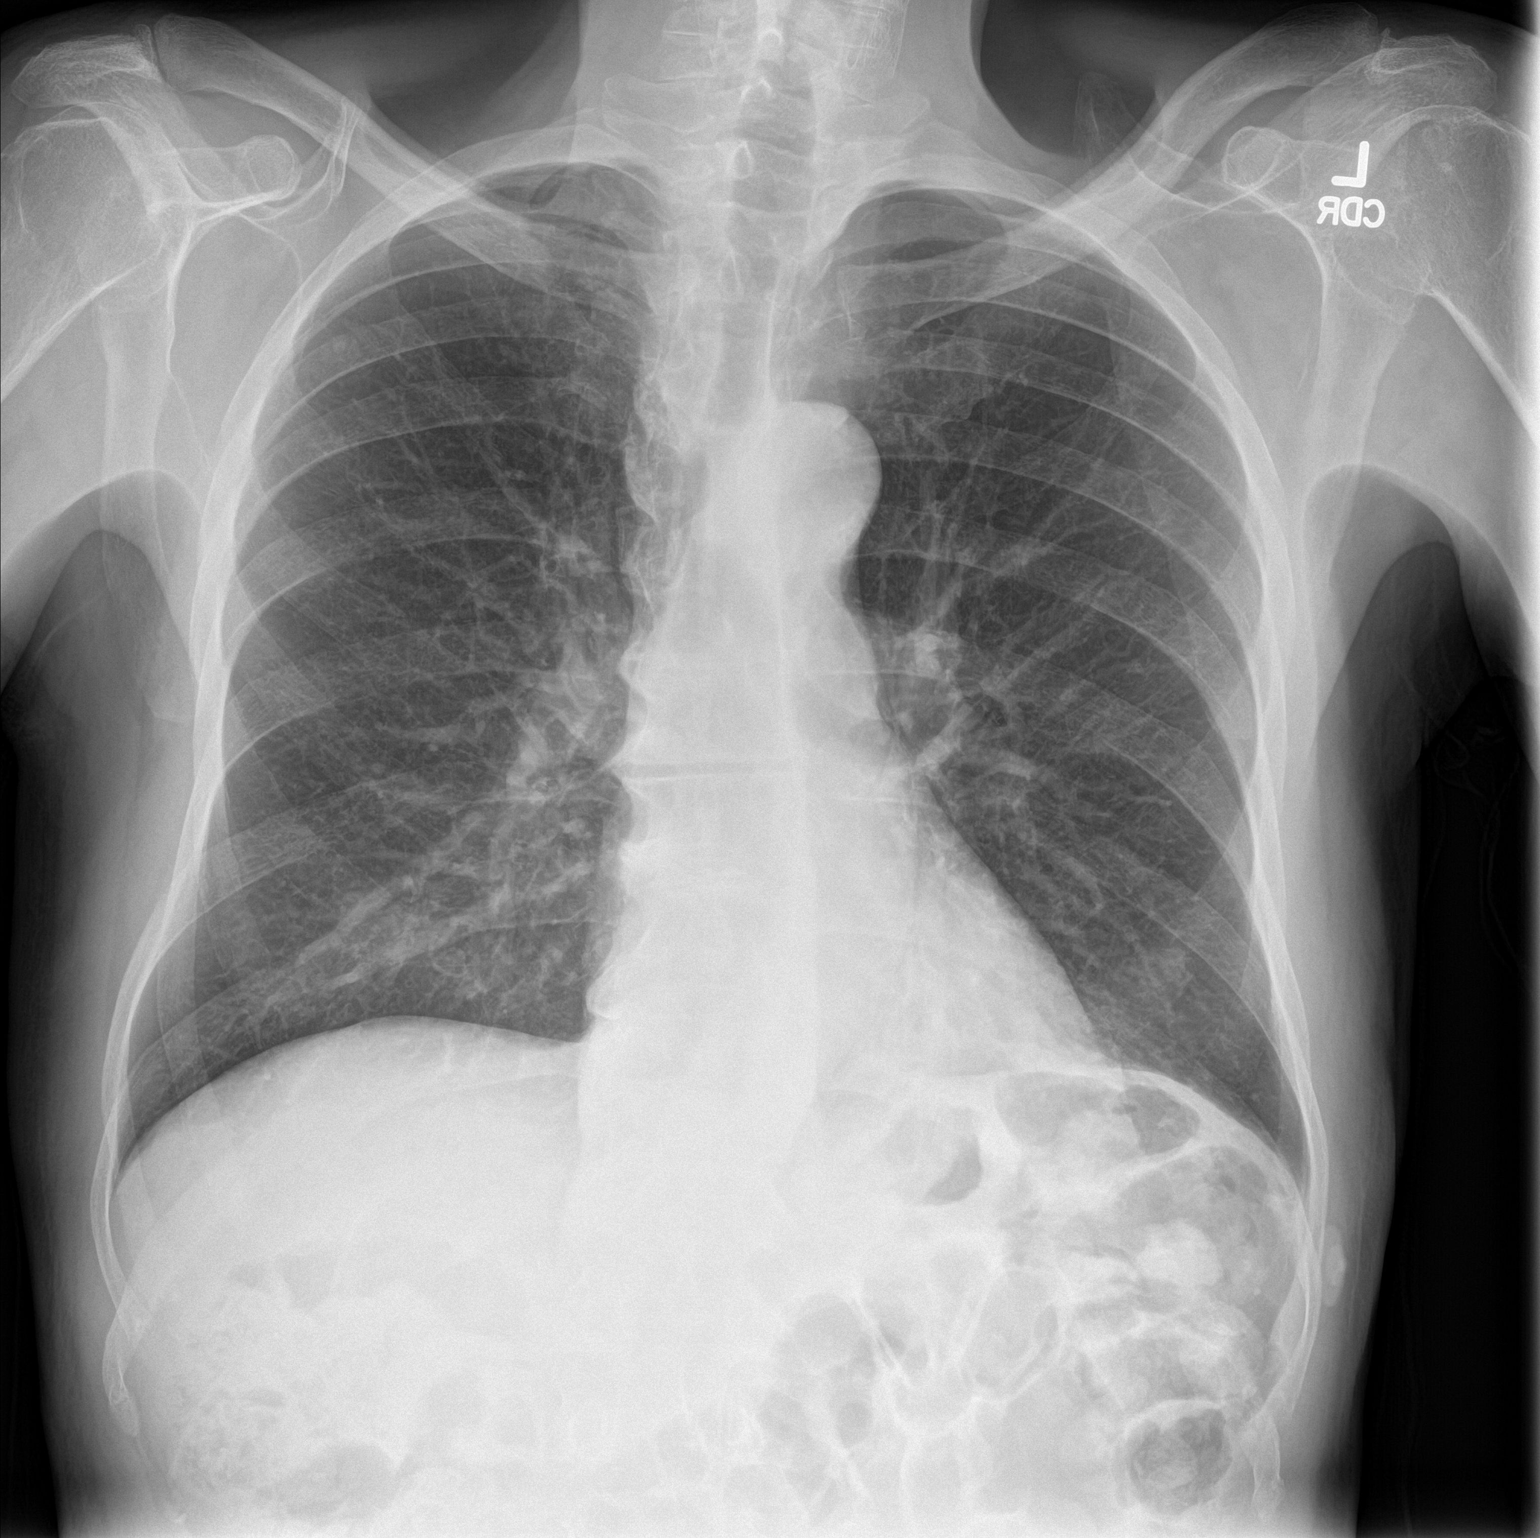

[chest lat]
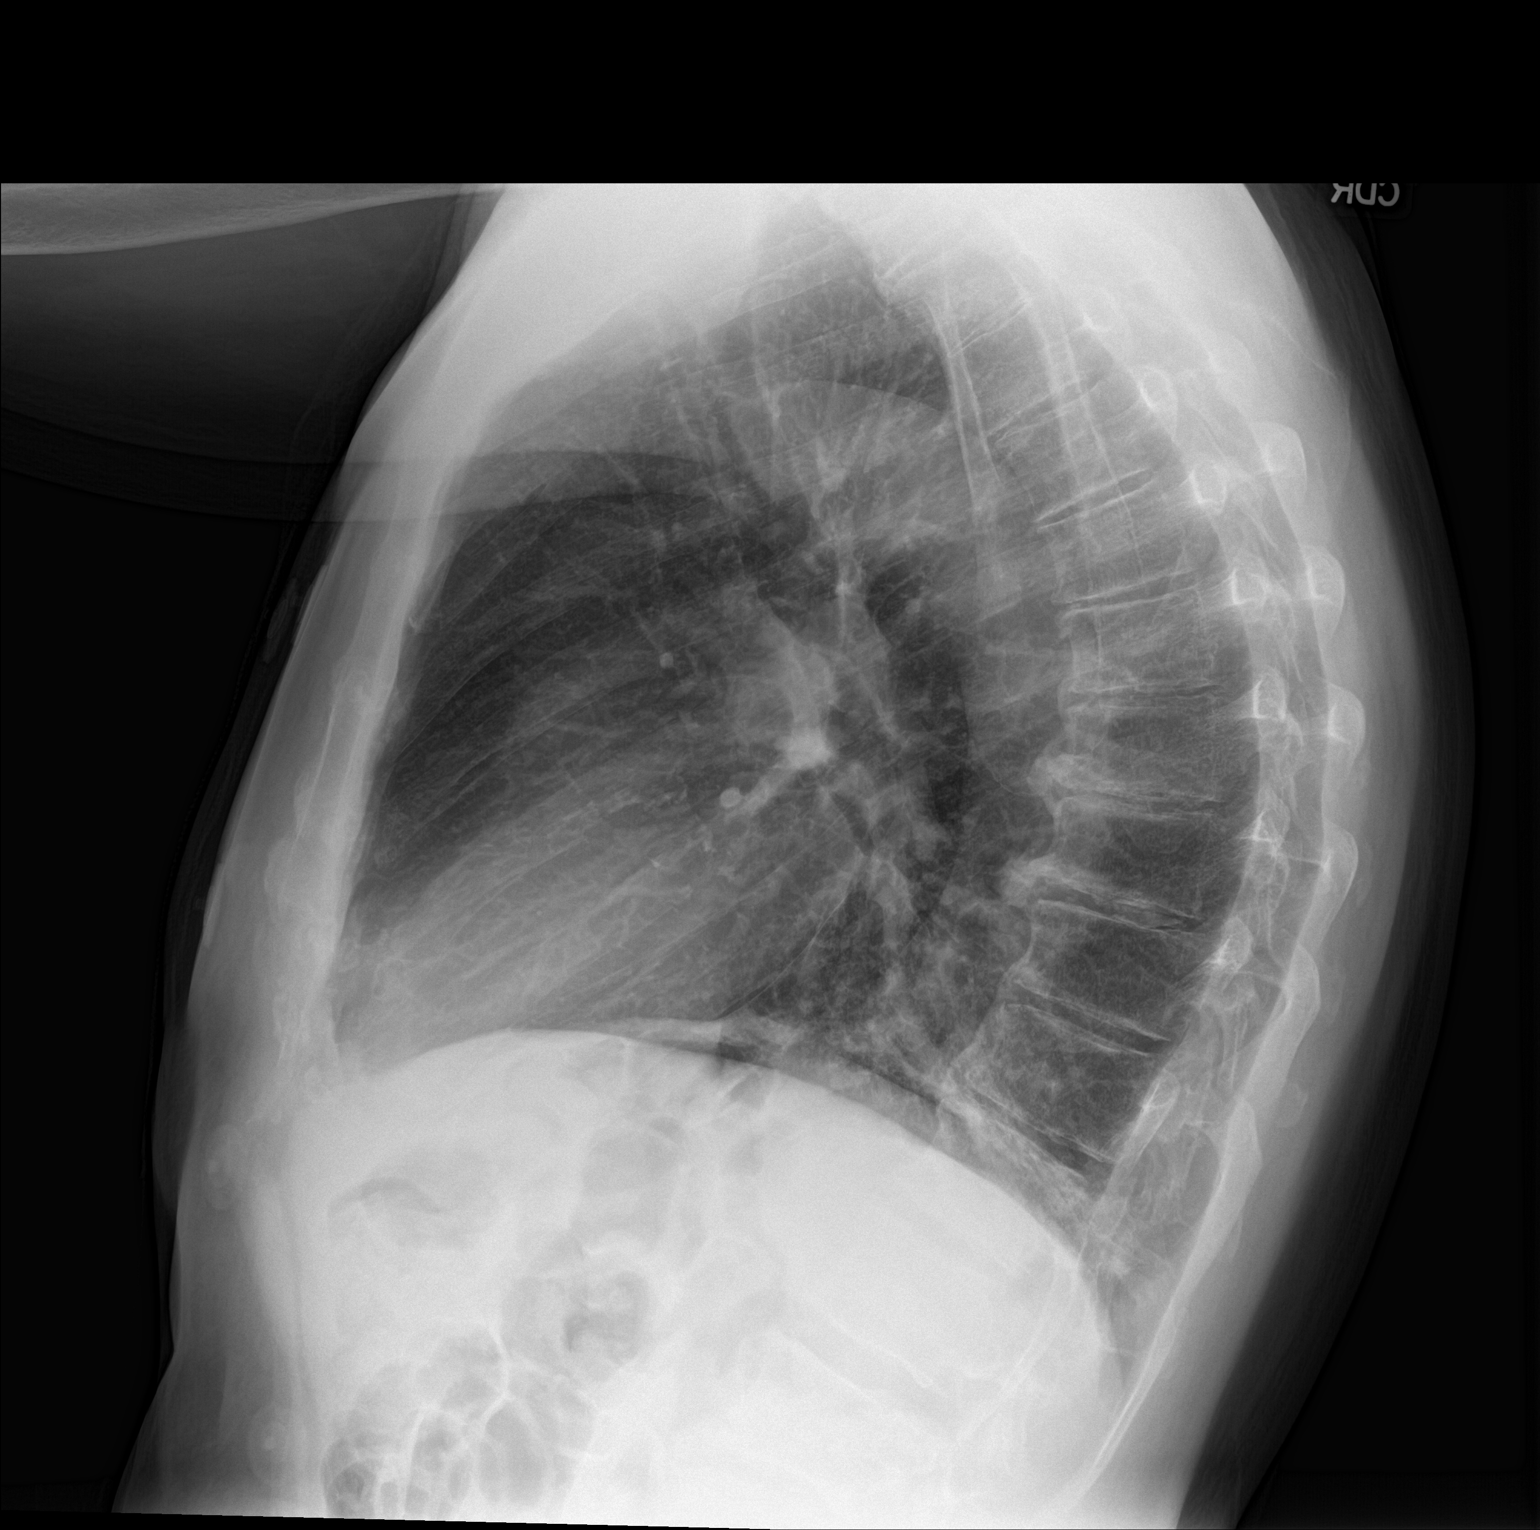

[2 of 2 positions shown; findings below may reference images not displayed]

FINDINGS: Mild hyperinflation. Normal heart size and pulmonary vascularity. No
focal airspace disease or consolidation in the lungs. No blunting of
costophrenic angles. No pneumothorax. Mediastinal contours appear
intact. Degenerative changes in the thoracic spine.
IMPRESSION: No active cardiopulmonary disease.
